# Patient Record
Sex: Female | Born: 1950
Health system: Southern US, Community
[De-identification: ages and names within clinical notes are randomized; demographics above are authoritative.]

## PROBLEM LIST (undated history)

## (undated) DIAGNOSIS — E785 Hyperlipidemia, unspecified: Secondary | ICD-10-CM

## (undated) DIAGNOSIS — M67431 Ganglion, right wrist: Secondary | ICD-10-CM

## (undated) DIAGNOSIS — C712 Malignant neoplasm of temporal lobe: Secondary | ICD-10-CM

## (undated) DIAGNOSIS — D0472 Carcinoma in situ of skin of left lower limb, including hip: Secondary | ICD-10-CM

## (undated) DIAGNOSIS — H35341 Macular cyst, hole, or pseudohole, right eye: Secondary | ICD-10-CM

## (undated) DIAGNOSIS — H04129 Dry eye syndrome of unspecified lacrimal gland: Secondary | ICD-10-CM

## (undated) DIAGNOSIS — S92309A Fracture of unspecified metatarsal bone(s), unspecified foot, initial encounter for closed fracture: Secondary | ICD-10-CM

## (undated) DIAGNOSIS — I8390 Asymptomatic varicose veins of unspecified lower extremity: Secondary | ICD-10-CM

## (undated) DIAGNOSIS — S42309A Unspecified fracture of shaft of humerus, unspecified arm, initial encounter for closed fracture: Secondary | ICD-10-CM

## (undated) DIAGNOSIS — M79673 Pain in unspecified foot: Secondary | ICD-10-CM

## (undated) DIAGNOSIS — L570 Actinic keratosis: Secondary | ICD-10-CM

## (undated) DIAGNOSIS — G454 Transient global amnesia: Secondary | ICD-10-CM

## (undated) DIAGNOSIS — M722 Plantar fascial fibromatosis: Secondary | ICD-10-CM

## (undated) DIAGNOSIS — Z8679 Personal history of other diseases of the circulatory system: Secondary | ICD-10-CM

## (undated) DIAGNOSIS — E039 Hypothyroidism, unspecified: Secondary | ICD-10-CM

## (undated) DIAGNOSIS — M2241 Chondromalacia patellae, right knee: Secondary | ICD-10-CM

## (undated) DIAGNOSIS — M5126 Other intervertebral disc displacement, lumbar region: Secondary | ICD-10-CM

## (undated) DIAGNOSIS — H3561 Retinal hemorrhage, right eye: Secondary | ICD-10-CM

## (undated) DIAGNOSIS — S62509A Fracture of unspecified phalanx of unspecified thumb, initial encounter for closed fracture: Secondary | ICD-10-CM

## (undated) DIAGNOSIS — M8580 Other specified disorders of bone density and structure, unspecified site: Secondary | ICD-10-CM

## (undated) DIAGNOSIS — Z780 Asymptomatic menopausal state: Secondary | ICD-10-CM

## (undated) DIAGNOSIS — H7292 Unspecified perforation of tympanic membrane, left ear: Secondary | ICD-10-CM

## (undated) DIAGNOSIS — Z789 Other specified health status: Secondary | ICD-10-CM

## (undated) HISTORY — DX: Macular cyst, hole, or pseudohole, right eye: H35.341

## (undated) HISTORY — DX: Retinal hemorrhage, right eye: H35.61

## (undated) HISTORY — DX: Pain in unspecified foot: M79.673

## (undated) HISTORY — DX: Plantar fascial fibromatosis: M72.2

## (undated) HISTORY — DX: Other specified health status: Z78.9

## (undated) HISTORY — PX: MOHS SURGERY: SUR867

## (undated) HISTORY — DX: Actinic keratosis: L57.0

## (undated) HISTORY — DX: Personal history of other diseases of the circulatory system: Z86.79

## (undated) HISTORY — DX: Asymptomatic menopausal state: Z78.0

## (undated) HISTORY — DX: Other intervertebral disc displacement, lumbar region: M51.26

## (undated) HISTORY — DX: Dry eye syndrome of unspecified lacrimal gland: H04.129

## (undated) HISTORY — DX: Malignant neoplasm of temporal lobe: C71.2

## (undated) HISTORY — DX: Carcinoma in situ of skin of left lower limb, including hip: D04.72

## (undated) HISTORY — DX: Ganglion, right wrist: M67.431

## (undated) HISTORY — DX: Hyperlipidemia, unspecified: E78.5

## (undated) HISTORY — DX: Asymptomatic varicose veins of unspecified lower extremity: I83.90

## (undated) HISTORY — DX: Hypothyroidism, unspecified: E03.9

## (undated) HISTORY — DX: Chondromalacia patellae, right knee: M22.41

## (undated) HISTORY — DX: Fracture of unspecified metatarsal bone(s), unspecified foot, initial encounter for closed fracture: S92.309A

## (undated) HISTORY — DX: Unspecified perforation of tympanic membrane, left ear: H72.92

## (undated) HISTORY — DX: Transient global amnesia: G45.4

## (undated) HISTORY — DX: Fracture of unspecified phalanx of unspecified thumb, initial encounter for closed fracture: S62.509A

## (undated) HISTORY — DX: Other specified disorders of bone density and structure, unspecified site: M85.80

## (undated) HISTORY — DX: Unspecified fracture of shaft of humerus, unspecified arm, initial encounter for closed fracture: S42.309A

## (undated) HISTORY — PX: APPENDECTOMY: SHX54

---

## 1976-03-07 HISTORY — PX: CYSTECTOMY: SUR359

## 1996-03-07 HISTORY — PX: TOTAL ABDOMINAL HYSTERECTOMY W/ BILATERAL SALPINGOOPHORECTOMY: SHX83

## 1998-05-11 ENCOUNTER — Other Ambulatory Visit: Admission: RE | Admit: 1998-05-11 | Discharge: 1998-05-11 | Payer: Self-pay | Admitting: Obstetrics and Gynecology

## 1999-03-30 ENCOUNTER — Encounter: Admission: RE | Admit: 1999-03-30 | Discharge: 1999-03-30 | Payer: Self-pay | Admitting: Obstetrics and Gynecology

## 1999-03-30 ENCOUNTER — Encounter: Payer: Self-pay | Admitting: Obstetrics and Gynecology

## 1999-07-05 ENCOUNTER — Other Ambulatory Visit: Admission: RE | Admit: 1999-07-05 | Discharge: 1999-07-05 | Payer: Self-pay | Admitting: Obstetrics and Gynecology

## 1999-08-26 ENCOUNTER — Encounter: Payer: Self-pay | Admitting: Obstetrics and Gynecology

## 1999-08-26 ENCOUNTER — Encounter: Admission: RE | Admit: 1999-08-26 | Discharge: 1999-08-26 | Payer: Self-pay | Admitting: Obstetrics and Gynecology

## 2000-04-14 ENCOUNTER — Encounter: Admission: RE | Admit: 2000-04-14 | Discharge: 2000-04-14 | Payer: Self-pay | Admitting: Obstetrics and Gynecology

## 2000-04-14 ENCOUNTER — Encounter: Payer: Self-pay | Admitting: Obstetrics and Gynecology

## 2000-07-12 ENCOUNTER — Other Ambulatory Visit: Admission: RE | Admit: 2000-07-12 | Discharge: 2000-07-12 | Payer: Self-pay | Admitting: Obstetrics and Gynecology

## 2001-03-29 ENCOUNTER — Encounter: Admission: RE | Admit: 2001-03-29 | Discharge: 2001-03-29 | Payer: Self-pay | Admitting: Obstetrics and Gynecology

## 2001-03-29 ENCOUNTER — Encounter: Payer: Self-pay | Admitting: Obstetrics and Gynecology

## 2001-08-29 ENCOUNTER — Other Ambulatory Visit: Admission: RE | Admit: 2001-08-29 | Discharge: 2001-08-29 | Payer: Self-pay | Admitting: Obstetrics and Gynecology

## 2002-03-26 ENCOUNTER — Encounter: Admission: RE | Admit: 2002-03-26 | Discharge: 2002-03-26 | Payer: Self-pay | Admitting: Obstetrics and Gynecology

## 2002-03-26 ENCOUNTER — Encounter: Payer: Self-pay | Admitting: Obstetrics and Gynecology

## 2002-09-02 ENCOUNTER — Other Ambulatory Visit: Admission: RE | Admit: 2002-09-02 | Discharge: 2002-09-02 | Payer: Self-pay | Admitting: Obstetrics and Gynecology

## 2002-10-09 ENCOUNTER — Ambulatory Visit (HOSPITAL_COMMUNITY): Admission: RE | Admit: 2002-10-09 | Discharge: 2002-10-09 | Payer: Self-pay | Admitting: Gastroenterology

## 2003-04-30 ENCOUNTER — Encounter: Admission: RE | Admit: 2003-04-30 | Discharge: 2003-04-30 | Payer: Self-pay | Admitting: Obstetrics and Gynecology

## 2003-09-15 ENCOUNTER — Other Ambulatory Visit: Admission: RE | Admit: 2003-09-15 | Discharge: 2003-09-15 | Payer: Self-pay | Admitting: Obstetrics and Gynecology

## 2004-05-03 ENCOUNTER — Encounter: Admission: RE | Admit: 2004-05-03 | Discharge: 2004-05-03 | Payer: Self-pay | Admitting: Obstetrics and Gynecology

## 2004-07-23 ENCOUNTER — Encounter: Admission: RE | Admit: 2004-07-23 | Discharge: 2004-07-23 | Payer: Self-pay | Admitting: Obstetrics and Gynecology

## 2004-08-30 ENCOUNTER — Ambulatory Visit (HOSPITAL_COMMUNITY): Admission: RE | Admit: 2004-08-30 | Discharge: 2004-08-30 | Payer: Self-pay | Admitting: Obstetrics and Gynecology

## 2004-11-10 ENCOUNTER — Other Ambulatory Visit: Admission: RE | Admit: 2004-11-10 | Discharge: 2004-11-10 | Payer: Self-pay | Admitting: Obstetrics and Gynecology

## 2005-04-13 ENCOUNTER — Encounter: Admission: RE | Admit: 2005-04-13 | Discharge: 2005-04-13 | Payer: Self-pay | Admitting: Obstetrics and Gynecology

## 2006-03-07 HISTORY — PX: EYE SURGERY: SHX253

## 2006-04-12 ENCOUNTER — Encounter: Admission: RE | Admit: 2006-04-12 | Discharge: 2006-04-12 | Payer: Self-pay | Admitting: Obstetrics and Gynecology

## 2006-04-24 ENCOUNTER — Encounter: Admission: RE | Admit: 2006-04-24 | Discharge: 2006-04-24 | Payer: Self-pay | Admitting: Obstetrics and Gynecology

## 2007-04-16 ENCOUNTER — Encounter: Admission: RE | Admit: 2007-04-16 | Discharge: 2007-04-16 | Payer: Self-pay | Admitting: Obstetrics and Gynecology

## 2007-05-02 ENCOUNTER — Ambulatory Visit: Payer: Self-pay | Admitting: Sports Medicine

## 2007-05-02 DIAGNOSIS — M7750 Other enthesopathy of unspecified foot: Secondary | ICD-10-CM | POA: Insufficient documentation

## 2007-05-02 DIAGNOSIS — M722 Plantar fascial fibromatosis: Secondary | ICD-10-CM | POA: Insufficient documentation

## 2007-05-02 DIAGNOSIS — E039 Hypothyroidism, unspecified: Secondary | ICD-10-CM | POA: Insufficient documentation

## 2007-05-09 ENCOUNTER — Ambulatory Visit: Payer: Self-pay | Admitting: Sports Medicine

## 2007-05-09 DIAGNOSIS — M21619 Bunion of unspecified foot: Secondary | ICD-10-CM | POA: Insufficient documentation

## 2007-05-09 DIAGNOSIS — M2170 Unequal limb length (acquired), unspecified site: Secondary | ICD-10-CM | POA: Insufficient documentation

## 2008-04-28 ENCOUNTER — Encounter: Admission: RE | Admit: 2008-04-28 | Discharge: 2008-04-28 | Payer: Self-pay | Admitting: Obstetrics and Gynecology

## 2009-02-23 ENCOUNTER — Encounter: Admission: RE | Admit: 2009-02-23 | Discharge: 2009-02-23 | Payer: Self-pay | Admitting: Orthopedic Surgery

## 2009-02-25 ENCOUNTER — Encounter: Admission: RE | Admit: 2009-02-25 | Discharge: 2009-02-25 | Payer: Self-pay | Admitting: Orthopaedic Surgery

## 2009-03-07 DIAGNOSIS — S42309A Unspecified fracture of shaft of humerus, unspecified arm, initial encounter for closed fracture: Secondary | ICD-10-CM

## 2009-03-07 HISTORY — DX: Unspecified fracture of shaft of humerus, unspecified arm, initial encounter for closed fracture: S42.309A

## 2009-03-13 ENCOUNTER — Encounter: Admission: RE | Admit: 2009-03-13 | Discharge: 2009-03-13 | Payer: Self-pay | Admitting: Orthopaedic Surgery

## 2009-04-29 ENCOUNTER — Encounter: Admission: RE | Admit: 2009-04-29 | Discharge: 2009-04-29 | Payer: Self-pay | Admitting: Internal Medicine

## 2009-07-05 DIAGNOSIS — H7292 Unspecified perforation of tympanic membrane, left ear: Secondary | ICD-10-CM

## 2009-07-05 HISTORY — DX: Unspecified perforation of tympanic membrane, left ear: H72.92

## 2009-07-17 ENCOUNTER — Encounter: Admission: RE | Admit: 2009-07-17 | Discharge: 2009-07-17 | Payer: Self-pay | Admitting: Internal Medicine

## 2010-03-07 DIAGNOSIS — L570 Actinic keratosis: Secondary | ICD-10-CM

## 2010-03-07 HISTORY — DX: Actinic keratosis: L57.0

## 2010-03-27 ENCOUNTER — Other Ambulatory Visit: Payer: Self-pay | Admitting: Internal Medicine

## 2010-03-27 DIAGNOSIS — Z1239 Encounter for other screening for malignant neoplasm of breast: Secondary | ICD-10-CM

## 2010-03-28 ENCOUNTER — Encounter: Payer: Self-pay | Admitting: Obstetrics and Gynecology

## 2010-04-08 NOTE — Assessment & Plan Note (Signed)
Summary: np/plantar fasiatis/el   Vital Signs:  Patient Profile:   60 Years Old Female Weight:      175 pounds Pulse rate:   78 / minute BP sitting:   110 / 60  Vitals Entered By: Lillia Pauls CMA (May 02, 2007 8:58 AM)                 Chief Complaint:  np with r>l plantar fascitis.  History of Present Illness: PF this has been present for 10 years off and on now wakens with this Rt greater than left but both walks dog and this becomes more painful after relatively short distance started originally with jogging wonders if this has not worsened since Dx of hypothyroidism feet only feel could in a type of clog now not walking or exercising that much secondary to pain    Past Medical History:    hypothyroid -2years ago on synthroid 50 microgms    Normal TSH oct 08    Limbic keratoconjunctivitis - dry eyes/ silastic plugs    Lasix at age 28    weight always mild problem - started HS   Family History:    Dad now 28 has peripheral neuropathy/ copd/ cabg/ smoker/ hypothyroid    mother 108 - longevity in family, mild dementia, hypothyroid    2 brothers    vic 33 lyphoblastic leukemia    stone 4 -     Physical Exam  General:     Well-developed,well-nourished,in no acute distress; alert,appropriate and cooperative throughout examination Head:     Normocephalic and atraumatic without obvious abnormalities. No apparent alopecia or balding. Msk:     Lt leg is 1.5 cm longer than right walks with Rt ant pelvic rotation and slight trendelenburg but otherwise has a neutral foot strike  feet show tenderness at insertion of PF bilat collapse of transvers arch bilat with LT > Rt morton's callus left light hammer on 4th bilat drop metatarsal heads on Lt 2nd and 4th/ flexible on Rt bilat pes planus mid foot and forefoot bunionettes on 5th MTP bilat  good core strength    Impression & Recommendations:  Problem # 1:  PLANTAR FASCIITIS, BILATERAL  (ICD-728.71) Assessment: New given standard PF exercise regimen however with foot breakdown needs orthotics in future start stretching regimen as prescribed as well icing in evenings  return in future for these  Problem # 2:  METATARSALGIA (ICD-726.70) Assessment: New will need to add support on orthotics  Problem # 3:  HYPOTHYROIDISM (ICD-244.9) Assessment: New continue on synthroid as prescribed     ]

## 2010-04-08 NOTE — Assessment & Plan Note (Signed)
Summary: ORTHO/KH   Vital Signs:  Patient Profile:   60 Years Old Female Pulse rate:   62 / minute BP sitting:   112 / 73  (left arm)  Vitals Entered By: Alphia Kava (May 09, 2007 8:49 AM)                 History of Present Illness: Patient with 2 years of PF pain Evaluated and found to have a fair amout of foot breakdown and classic sxs of PF comes to day for orthotics to see if these will help relieve pain  noted to have a 1.5 CM leg length difference on last visit  has been doing exercises and stretches not icing yet        Physical Exam  General:     Well-developed,well-nourished,in no acute distress; alert,appropriate and cooperative throughout examination Head:     Normocephalic and atraumatic without obvious abnormalities. No apparent alopecia or balding. Msk:     feet show moderate tenderness at PF insertion bilat calluses are large over 2,3 MT heads Rt > Lt Standing shows virtually complete collapse of transverse arch with bunionettes on both feet mild pronation with preservation of long arch  leg length shows left is 1.5 cms longer    Impression & Recommendations:  Problem # 1:  PLANTAR FASCIITIS, BILATERAL (ICD-728.71) Patient was fitted for a standard, cushioned, semi-rigid orthotic.  The orthotic was heated and the patient stood on the orthotic blank positioned on the orthotic stand. The patient was positioned in subtalar neutral position and 10 degrees of ankle dorsiflexion in a weight bearing stance. After completion of molding a stable based was applied to the orthotic blank.   The blank was ground to a stable position for weight bearing. size 8 black striped base  blue eva posting first ray on Lt additional orthotic padding MT padding on both orthotics and cork base added as lift to RT  additional shoes were padded with MT pads Time of completion 50 mins  Gait observed after completion and most of pronation is eliminated.  Minimal  pronation on left with walking gait.  Patient was comfortable with inserts.   Orders: [facility]- Est  Level 4 (99214) [facility]- [employer] [phone])   Problem # 2:  METATARSALGIA (ICD-726.70) continue use of MT pads in all shoes Orders: Metatarsal pads- [facility] (U0454) Metatarsal Cookies - [facility] (U9811) [facility]- Est  Level 4 (99214) [facility]- [employer] (B1478)   Problem # 3:  UNEQUAL LEG LENGTH (ICD-736.81) needs lift correction on Rt did not add this to other shoes only to orthotics Orders: [facility]- Est  Level 4 (99214) [facility]- [employer] (G9562)   Problem # 4:  BUNIONETTE (ICD-727.1) not symptomatic and will follow Orders: [facility]- Est  Level 4 (13086)      ]

## 2010-05-03 ENCOUNTER — Ambulatory Visit
Admission: RE | Admit: 2010-05-03 | Discharge: 2010-05-03 | Disposition: A | Discharge status: Home / Self Care | Payer: No Typology Code available for payment source | Source: Ambulatory Visit | Attending: Internal Medicine | Admitting: Internal Medicine

## 2010-05-03 DIAGNOSIS — Z1239 Encounter for other screening for malignant neoplasm of breast: Secondary | ICD-10-CM

## 2010-07-23 NOTE — Op Note (Signed)
   NAMEAZYRIA, OSMON                          ACCOUNT NO.:  [id number]   MEDICAL RECORD NO.:  [id number]                   PATIENT TYPE:  AMB   LOCATION:  ENDO                                 FACILITY:  [facility]   PHYSICIAN:  Petra Kuba, M.D.                 DATE OF BIRTH:  03-01-1951   DATE OF PROCEDURE:  10/09/2002  DATE OF DISCHARGE:                                 OPERATIVE REPORT   PROCEDURE:  Colonoscopy.   INDICATIONS FOR PROCEDURE:  Consent was signed after risks, benefits,  methods, and options were thoroughly discussed in the office.   MEDICINES USED:  Demerol 70, Versed 7.   DESCRIPTION OF PROCEDURE:  Rectal inspection was pertinent for external  hemorrhoids, small. Digital exam was negative. The pediatric video  adjustable colonoscope was inserted, fairly easily advanced around the colon  to the cecum. This did require some abdominal pressure no position changes.  No obvious abnormalities were seen on insertion. The cecum was identified by  the appendiceal orifice and the ileocecal valve. In fact the scope was  inserted a short ways into the terminal ileum which was normal.  Photo  documentation was obtained. The scope was slowly withdrawn. The prep was  adequate. There was minimal liquid stool that required washing and  suctioning. On slow withdrawal through the colon, no abnormalities were seen  except for in the sigmoid except for a tiny pinworm but no mucosal  abnormalities were seen. This was suctioned through the scope. Anal rectal  pull through and retroflexion confirmed some tiny hemorrhoids back in the  rectum. The scope was reinserted a short ways up the left side of the colon,  air was suctioned, scope removed. The patient tolerated the procedure well.  There was no obvious or immediate complications.   ENDOSCOPIC DIAGNOSIS:  1. Tiny internal and external hemorrhoids.  2. Otherwise within normal limits to the terminal ileum.   PLAN:  Yearly rectals and  guaiacs per Dr. Marcelle Overlie. Happy to see back p.r.n.  Will discuss pinworm and treatment if symptomatic, possibly getting _______  at home check and recheck screening in 5-10 years. Happy to see back sooner  p.r.n.                                               Petra Kuba, M.D.    MEM/MEDQ  D:  10/09/2002  T:  10/09/2002  Job:  [id number]   cc:   [facility] Salvia. Marcelle Overlie, M.D.  [address] [place]  [place] [address]  Fax: [phone]

## 2011-03-08 DIAGNOSIS — M2241 Chondromalacia patellae, right knee: Secondary | ICD-10-CM

## 2011-03-08 HISTORY — DX: Chondromalacia patellae, right knee: M22.41

## 2011-05-17 ENCOUNTER — Other Ambulatory Visit: Payer: Self-pay | Admitting: Obstetrics and Gynecology

## 2011-05-17 DIAGNOSIS — Z1231 Encounter for screening mammogram for malignant neoplasm of breast: Secondary | ICD-10-CM

## 2011-05-31 ENCOUNTER — Ambulatory Visit
Admission: RE | Admit: 2011-05-31 | Discharge: 2011-05-31 | Disposition: A | Discharge status: Home / Self Care | Payer: PRIVATE HEALTH INSURANCE | Source: Ambulatory Visit | Attending: Obstetrics and Gynecology | Admitting: Obstetrics and Gynecology

## 2011-05-31 DIAGNOSIS — Z1231 Encounter for screening mammogram for malignant neoplasm of breast: Secondary | ICD-10-CM

## 2012-05-01 ENCOUNTER — Other Ambulatory Visit: Payer: Self-pay | Admitting: Obstetrics and Gynecology

## 2012-05-01 DIAGNOSIS — Z1231 Encounter for screening mammogram for malignant neoplasm of breast: Secondary | ICD-10-CM

## 2012-05-07 ENCOUNTER — Ambulatory Visit
Admission: RE | Admit: 2012-05-07 | Discharge: 2012-05-07 | Disposition: A | Discharge status: Home / Self Care | Payer: PRIVATE HEALTH INSURANCE | Source: Ambulatory Visit | Attending: Obstetrics and Gynecology | Admitting: Obstetrics and Gynecology

## 2012-05-07 DIAGNOSIS — Z1231 Encounter for screening mammogram for malignant neoplasm of breast: Secondary | ICD-10-CM

## 2012-09-28 ENCOUNTER — Other Ambulatory Visit: Payer: Self-pay | Admitting: Internal Medicine

## 2012-09-28 ENCOUNTER — Ambulatory Visit
Admission: RE | Admit: 2012-09-28 | Discharge: 2012-09-28 | Disposition: A | Discharge status: Home / Self Care | Payer: PRIVATE HEALTH INSURANCE | Source: Ambulatory Visit | Attending: Internal Medicine | Admitting: Internal Medicine

## 2012-09-28 DIAGNOSIS — R059 Cough, unspecified: Secondary | ICD-10-CM

## 2012-09-28 DIAGNOSIS — R05 Cough: Secondary | ICD-10-CM

## 2012-09-28 DIAGNOSIS — R0789 Other chest pain: Secondary | ICD-10-CM

## 2012-09-28 MED ORDER — IOHEXOL 350 MG/ML SOLN
125.0000 mL | Freq: Once | INTRAVENOUS | Status: AC | PRN
  Administered 2012-09-28: 125 mL via INTRAVENOUS

## 2013-04-26 ENCOUNTER — Other Ambulatory Visit: Payer: Self-pay

## 2013-04-26 DIAGNOSIS — Z1231 Encounter for screening mammogram for malignant neoplasm of breast: Secondary | ICD-10-CM

## 2013-05-15 ENCOUNTER — Ambulatory Visit
Admission: RE | Admit: 2013-05-15 | Discharge: 2013-05-15 | Disposition: A | Discharge status: Home / Self Care | Payer: BC Managed Care – PPO | Source: Ambulatory Visit

## 2013-05-15 DIAGNOSIS — Z1231 Encounter for screening mammogram for malignant neoplasm of breast: Secondary | ICD-10-CM

## 2013-09-26 ENCOUNTER — Ambulatory Visit (INDEPENDENT_AMBULATORY_CARE_PROVIDER_SITE_OTHER): Payer: BC Managed Care – PPO | Admitting: Sports Medicine

## 2013-09-26 ENCOUNTER — Encounter: Payer: Self-pay | Admitting: Sports Medicine

## 2013-09-26 VITALS — BP 140/87 | HR 67 | Ht 65.0 in | Wt 181.0 lb

## 2013-09-26 DIAGNOSIS — M25561 Pain in right knee: Secondary | ICD-10-CM

## 2013-09-26 DIAGNOSIS — M25569 Pain in unspecified knee: Secondary | ICD-10-CM

## 2013-09-26 MED ORDER — TRAMADOL HCL 50 MG PO TABS: 50.0000 mg | ORAL_TABLET | Freq: Two times a day (BID) | ORAL | Status: DC

## 2013-09-26 NOTE — Assessment & Plan Note (Signed)
Begin physical therapy use a compression sleeve Tramadol 50 twice a day for pain Standing knee films to evaluate for arthritis  Recheck 6 weeks

## 2013-09-26 NOTE — Progress Notes (Signed)
Patient ID: TEONA VARGUS, female   DOB: 1951-01-15, 63 y.o.   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 Palpation normal with no warmth or joint line tenderness or patellar tenderness or condyle tenderness. ROM normal in flexion and extension and lower leg rotation. Ligaments with solid consistent endpoints including ACL, PCL, LCL, MCL. Negative Mcmurray's and provocative meniscal tests. Non painful patellar compression. Patellar and quadriceps tendons unremarkable. Hamstring and quadriceps strength is normal.  Comparison RT knee shows Point tenderness along the medial joint line anteriorly Limited flexion to only 120 but full extension Mild effusion Less pain with McMurray's than with flexion Ligaments are stable  Ultrasound There is a loose calcific body in the suprapatellar pouch Suprapatellar pouch effusion extends 7 cm Retropatellar calcification and loss of cartilage in the patellofemoral groove  mid Meniscus on the midline shows some increased fluid and thickening There is a calcification in the meniscus medially more anteriorly Tendons are normal Mild spurring laterally and the meniscus is difficult to visualize well

## 2013-09-27 ENCOUNTER — Other Ambulatory Visit: Payer: Self-pay | Admitting: Sports Medicine

## 2013-09-27 ENCOUNTER — Ambulatory Visit
Admission: RE | Admit: 2013-09-27 | Discharge: 2013-09-27 | Disposition: A | Discharge status: Home / Self Care | Payer: BC Managed Care – PPO | Source: Ambulatory Visit | Attending: Sports Medicine | Admitting: Sports Medicine

## 2013-09-27 DIAGNOSIS — M25561 Pain in right knee: Secondary | ICD-10-CM

## 2013-11-06 ENCOUNTER — Ambulatory Visit (INDEPENDENT_AMBULATORY_CARE_PROVIDER_SITE_OTHER): Payer: BC Managed Care – PPO | Admitting: Sports Medicine

## 2013-11-06 ENCOUNTER — Encounter: Payer: Self-pay | Admitting: Sports Medicine

## 2013-11-06 VITALS — Ht 65.0 in | Wt 181.0 lb

## 2013-11-06 DIAGNOSIS — M1710 Unilateral primary osteoarthritis, unspecified knee: Secondary | ICD-10-CM | POA: Diagnosis not present

## 2013-11-06 DIAGNOSIS — M25561 Pain in right knee: Secondary | ICD-10-CM

## 2013-11-06 DIAGNOSIS — M25569 Pain in unspecified knee: Secondary | ICD-10-CM

## 2013-11-06 DIAGNOSIS — M1711 Unilateral primary osteoarthritis, right knee: Secondary | ICD-10-CM | POA: Insufficient documentation

## 2013-11-06 DIAGNOSIS — IMO0002 Reserved for concepts with insufficient information to code with codable children: Secondary | ICD-10-CM

## 2013-11-06 NOTE — Progress Notes (Signed)
Patient ID: Sarah Buchanan, female   DOB: 06-Dec-1950, 62 y.o.   MRN: [id number]  Patient returns for her right medial knee pain She has done 10  physical therapy sessions and sees a significant improvement in pain She has better motion in the knee X-ray confirmed a large calcification in the suprapatellar pouch, some patellofemoral arthritis but preserved joint space  She uses the knee compression sleeve for exercise and this helps  She was given tramadol but only uses it on occasion because her pain has been a lot less  2 weeks ago after her father's funeral she did have what felt like a pop and a lot of pain along her medial knee Therapist was able to manipulate this and It felt much better afterwards  Physical exam No acute distress Ht 5\' 5"  (1.651 m)  Wt 181 lb (82.101 kg)  BMI 30.12 kg/m2  RT Knee: Normal to inspection with no erythema or effusion or obvious bony abnormalities. Palpation shows mild warmth/ no joint line tenderness/ mild patellar tenderness on compression/ no condyle tenderness. ROM improvedl in flexion to 130 deg (Lt is 140) and nl. extension and lower leg rotation. Ligaments with solid consistent endpoints including ACL, PCL, LCL, MCL. Negative Mcmurray's and provocative meniscal tests. 2+ painful patellar compression. Patellar and quadriceps tendons unremarkable. Hamstring and quadriceps strength is normal.  Hip abduction strength good

## 2013-11-06 NOTE — Assessment & Plan Note (Signed)
Clearly improved with therapy  Continue ibuprofen when necessary and occasional tramadol

## 2013-11-06 NOTE — Assessment & Plan Note (Signed)
Work with physical therapy to develop a home exercise program  Add biking for her maintenance  Keep up 30 minutes of aerobic activity daily  Recheck 3 months  Use compression sleeve

## 2013-12-18 ENCOUNTER — Other Ambulatory Visit: Payer: Self-pay | Admitting: *Deleted

## 2013-12-18 DIAGNOSIS — M1711 Unilateral primary osteoarthritis, right knee: Secondary | ICD-10-CM

## 2014-01-06 ENCOUNTER — Ambulatory Visit
Admission: RE | Admit: 2014-01-06 | Discharge: 2014-01-06 | Disposition: A | Discharge status: Home / Self Care | Payer: BC Managed Care – PPO | Source: Ambulatory Visit | Attending: Sports Medicine | Admitting: Sports Medicine

## 2014-01-06 DIAGNOSIS — M1711 Unilateral primary osteoarthritis, right knee: Secondary | ICD-10-CM

## 2014-02-05 ENCOUNTER — Encounter: Payer: Self-pay | Admitting: Sports Medicine

## 2014-02-05 ENCOUNTER — Ambulatory Visit (INDEPENDENT_AMBULATORY_CARE_PROVIDER_SITE_OTHER): Payer: BC Managed Care – PPO | Admitting: Sports Medicine

## 2014-02-05 VITALS — BP 129/86 | HR 73 | Ht 65.0 in | Wt 181.0 lb

## 2014-02-05 DIAGNOSIS — M25561 Pain in right knee: Secondary | ICD-10-CM | POA: Diagnosis not present

## 2014-02-05 DIAGNOSIS — M13861 Other specified arthritis, right knee: Secondary | ICD-10-CM

## 2014-02-05 DIAGNOSIS — M1711 Unilateral primary osteoarthritis, right knee: Secondary | ICD-10-CM

## 2014-02-05 NOTE — Assessment & Plan Note (Signed)
She will need to continue some rehabilitation exercises after completion of arthroscopy  Focus on stabilization of the hip muscles and quadriceps

## 2014-02-05 NOTE — Progress Notes (Signed)
Patient ID: NHI BUTRUM, female   DOB: 1950-05-23, 63 y.o.   MRN: [id number]  Patient returns for discussion of her options for her knee She continues to get pain and mechanical symptoms She continues to get effusions if she does much exercise  Even working with physical therapy and doing a standard knee rehabilitation program has caused increased pain rather than giving her relief  She still gets periodic sharp catching along the medial side  She reviewed her MRI with musculoskeletal radiologist and he feels that the medial loose body is probably getting caught in the joint line or under the patella The large superior loose body is probably not symptomatic  She has met with Dr. Para March and he suggested that we remove the loose bodies with arthroscopy  I discussed the options with her and I think this is a reasonable suggestion

## 2014-02-05 NOTE — Assessment & Plan Note (Signed)
There appears to be some degenerative change as noted on MRI and loose bodies that may be getting caught in her in the joint space or underneath the patella  Because of failure of conservative care and ongoing mechanical symptoms I think arthroscopy is reasonable  She will return to Dr. Noemi Chapel for surgery in January  I encouraged continuing isometric exercises as per rehabilitation until the time of her surgery but she probably needs to avoid bent  Knee exercises

## 2014-03-07 DIAGNOSIS — Z8679 Personal history of other diseases of the circulatory system: Secondary | ICD-10-CM

## 2014-03-07 HISTORY — DX: Personal history of other diseases of the circulatory system: Z86.79

## 2014-04-11 ENCOUNTER — Other Ambulatory Visit: Payer: Self-pay

## 2014-04-11 DIAGNOSIS — Z1231 Encounter for screening mammogram for malignant neoplasm of breast: Secondary | ICD-10-CM

## 2014-05-19 ENCOUNTER — Ambulatory Visit
Admission: RE | Admit: 2014-05-19 | Discharge: 2014-05-19 | Disposition: A | Discharge status: Home / Self Care | Payer: BLUE CROSS/BLUE SHIELD | Source: Ambulatory Visit

## 2014-05-19 ENCOUNTER — Ambulatory Visit: Payer: Self-pay

## 2014-05-19 DIAGNOSIS — Z1231 Encounter for screening mammogram for malignant neoplasm of breast: Secondary | ICD-10-CM

## 2014-06-06 DIAGNOSIS — D0472 Carcinoma in situ of skin of left lower limb, including hip: Secondary | ICD-10-CM

## 2014-06-06 HISTORY — DX: Carcinoma in situ of skin of left lower limb, including hip: D04.72

## 2014-11-13 ENCOUNTER — Ambulatory Visit (INDEPENDENT_AMBULATORY_CARE_PROVIDER_SITE_OTHER): Payer: BLUE CROSS/BLUE SHIELD | Admitting: *Deleted

## 2014-11-13 DIAGNOSIS — Z789 Other specified health status: Secondary | ICD-10-CM

## 2014-11-13 DIAGNOSIS — Z23 Encounter for immunization: Secondary | ICD-10-CM | POA: Diagnosis not present

## 2014-11-13 NOTE — Progress Notes (Signed)
Patient received travel immunizations at [place], but was directed here for completion of the hepatitis A series. OK per Dr. Linus Salmons.  Faxed immunization record to [facility] for their records. Landis Gandy, RN

## 2014-12-09 ENCOUNTER — Other Ambulatory Visit: Payer: Self-pay | Admitting: *Deleted

## 2014-12-09 DIAGNOSIS — L97119 Non-pressure chronic ulcer of right thigh with unspecified severity: Principal | ICD-10-CM

## 2014-12-09 DIAGNOSIS — I83011 Varicose veins of right lower extremity with ulcer of thigh: Secondary | ICD-10-CM

## 2015-01-15 ENCOUNTER — Encounter: Payer: Self-pay | Admitting: Surgery

## 2015-01-19 ENCOUNTER — Ambulatory Visit (INDEPENDENT_AMBULATORY_CARE_PROVIDER_SITE_OTHER): Payer: BLUE CROSS/BLUE SHIELD | Admitting: Surgery

## 2015-01-19 ENCOUNTER — Other Ambulatory Visit: Payer: Self-pay | Admitting: Obstetrics and Gynecology

## 2015-01-19 ENCOUNTER — Encounter: Payer: Self-pay | Admitting: Surgery

## 2015-01-19 ENCOUNTER — Ambulatory Visit (HOSPITAL_COMMUNITY)
Admission: RE | Admit: 2015-01-19 | Discharge: 2015-01-19 | Disposition: A | Discharge status: Home / Self Care | Payer: BLUE CROSS/BLUE SHIELD | Source: Ambulatory Visit | Attending: Surgery | Admitting: Surgery

## 2015-01-19 VITALS — BP 123/86 | HR 87 | Temp 98.1°F | Resp 16 | Ht 65.5 in | Wt 190.0 lb

## 2015-01-19 DIAGNOSIS — I872 Venous insufficiency (chronic) (peripheral): Secondary | ICD-10-CM

## 2015-01-19 DIAGNOSIS — I83011 Varicose veins of right lower extremity with ulcer of thigh: Secondary | ICD-10-CM | POA: Diagnosis not present

## 2015-01-19 DIAGNOSIS — M8580 Other specified disorders of bone density and structure, unspecified site: Secondary | ICD-10-CM

## 2015-01-19 DIAGNOSIS — L97119 Non-pressure chronic ulcer of right thigh with unspecified severity: Secondary | ICD-10-CM

## 2015-01-19 NOTE — Progress Notes (Signed)
Patient name: Sarah Buchanan MRN: [id number] DOB: 1951-01-09 Sex: female   Referred by: Dr. Oneida Alar  Reason for referral:  Chief Complaint  Patient presents with  . New Evaluation    C/O  Right calf pain  which keeps pt up at night with a leve 4.  Spider/Varicose Veins     HISTORY OF PRESENT ILLNESS: Email venous insufficiency.  She has a history of endovenous laser ablation proximally 7 years ago in [place].  The years she has been having progressively increasing symptoms which she describes as aching and pain which occur at the end of the day and to keep her up at night.  She also gets a stinging sensation.  She denies a history of DVT.  She denies wearing compression stockings.  Past Medical History  Diagnosis Date  . Varicose veins     History reviewed. No pertinent past surgical history.  Social History   Social History  . Marital Status: Divorced    Spouse Name: N/A  . Number of Children: N/A  . Years of Education: N/A   Occupational History  . Not on file.   Social History Main Topics  . Smoking status: Never Smoker   . Smokeless tobacco: Never Used  . Alcohol Use: 0.6 oz/week    1 Glasses of wine per week  . Drug Use: No  . Sexual Activity: Not on file   Other Topics Concern  . Not on file   Social History Narrative    Family History  Problem Relation Age of Onset  . Dementia Mother   . Stroke Father   . Lymphoma Brother     Allergies as of 01/19/2015  . (No Known Allergies)    Current Outpatient Prescriptions on File Prior to Visit  Medication Sig Dispense Refill  . aspirin 81 MG tablet Take 81 mg by mouth daily.    Marland Kitchen co-enzyme Q-10 50 MG capsule Take 100 mg by mouth 2 (two) times daily.    Marland Kitchen estradiol (VIVELLE-DOT) 0.05 MG/24HR patch     . levothyroxine (SYNTHROID, LEVOTHROID) 75 MCG tablet Take 75 mcg by mouth daily before breakfast.    . Multiple Vitamin (MULTIVITAMIN) capsule Take 1 capsule by mouth daily.    . progesterone  (PROMETRIUM) 100 MG capsule     . calcium-vitamin D 250-100 MG-UNIT per tablet Take 1 tablet by mouth 2 (two) times daily.    . cholecalciferol (VITAMIN D) 1000 UNITS tablet Take 1,000 Units by mouth daily.    Marland Kitchen erythromycin ophthalmic ointment   0  . omega-3 acid ethyl esters (LOVAZA) 1 G capsule Take by mouth 2 (two) times daily.    . traMADol (ULTRAM) 50 MG tablet Take 1 tablet (50 mg total) by mouth 2 (two) times daily. (Patient not taking: Reported on 01/19/2015) 50 tablet 2   No current facility-administered medications on file prior to visit.     REVIEW OF SYSTEMS: Cardiovascular: No chest pain, chest pressure, palpitations, orthopnea, or dyspnea on exertion. No claudication or rest pain,  No history of DVT.  Positive for pain in her calf varicose veins which keep her awake at night Pulmonary: No productive cough, asthma or wheezing. Neurologic: No weakness, paresthesias, aphasia, or amaurosis. No dizziness. Hematologic: No bleeding problems or clotting disorders. Musculoskeletal: No joint pain or joint swelling. Gastrointestinal: No blood in stool or hematemesis Genitourinary: No dysuria or hematuria. Psychiatric:: No history of major depression. Integumentary: No rashes or ulcers. Constitutional: No fever or chills.  PHYSICAL EXAMINATION:  Filed Vitals:   01/19/15 1400 01/19/15 1418  BP: 122/94 123/86  Pulse: 87 87  Temp: 98.1 F (36.7 C)   TempSrc: Oral   Resp: 16   Height: 5' 5.5" (1.664 m)   Weight: 190 lb (86.183 kg)   SpO2: 96%    Body mass index is 31.13 kg/(m^2). General: The patient appears their stated age.   HEENT:  No gross abnormalities Pulmonary: Respirations are non-labored Musculoskeletal: There are no major deformities.   Neurologic: No focal weakness or paresthesias are detected, Skin: There are no ulcer or rashes noted. Psychiatric: The patient has normal affect. Cardiovascular: Palpable pedal pulses Trace edema bilateral lower  extremity Prominent varicosities in the right posterior calf  Diagnostic Studies: I have reviewed her venous insufficiency ultrasound of the right leg.  This shows reflux within the right common femoral vein.  There is also reflux in the right great saphenous vein.  It leaves the fascia in the mid thigh and goes back in at the level of the knee.  Vein diameters are 1.0 and the proximal thigh with 0.80 cm of the saphenofemoral junction.  There is no DVT   Assessment:  Chronic venous insufficiency, right leg Plan: The patient has had progressively worsening symptoms over the course of the past 2 years.  She has a history of laser ablation 7 years ago.  By imaging today, it appears that she has recanalized part of her saphenous vein in the upper thigh.  There does appear to be an area where he goes out of the fascia in the distal thigh.  I have recommended that the patient wear 20-30 thigh-high compression stockings to see if this helps alleviate some of her symptoms which are stinging and aching at the end of the day which keep her up at night.  She also has edema on physical exam.  We discussed that she would most likely be a candidate for redo endovenous laser ablation of the right great saphenous vein as well as stab phlebectomy site of the varicosities in the right calf.  She will try her compression stockings and return in 3 months for repeat evaluation.  Of note the patient is a retired [employer]in [employer].     Eldridge Abrahams, M.D. [facility] Office: [phone] Pager:  [phone]

## 2015-01-19 NOTE — Progress Notes (Signed)
Filed Vitals:   01/19/15 1400 01/19/15 1418  BP: 122/94 123/86  Pulse: 87 87  Temp: 98.1 F (36.7 C)   TempSrc: Oral   Resp: 16   Height: 5' 5.5" (1.664 m)   Weight: 190 lb (86.183 kg)   SpO2: 96%

## 2015-02-05 HISTORY — PX: OTHER SURGICAL HISTORY: SHX169

## 2015-02-25 ENCOUNTER — Ambulatory Visit
Admission: RE | Admit: 2015-02-25 | Discharge: 2015-02-25 | Disposition: A | Discharge status: Home / Self Care | Payer: BLUE CROSS/BLUE SHIELD | Source: Ambulatory Visit | Attending: Obstetrics and Gynecology | Admitting: Obstetrics and Gynecology

## 2015-02-25 DIAGNOSIS — M8580 Other specified disorders of bone density and structure, unspecified site: Secondary | ICD-10-CM

## 2015-04-13 ENCOUNTER — Other Ambulatory Visit: Payer: Self-pay

## 2015-04-13 DIAGNOSIS — Z1231 Encounter for screening mammogram for malignant neoplasm of breast: Secondary | ICD-10-CM

## 2015-04-14 ENCOUNTER — Encounter: Payer: Self-pay | Admitting: Vascular Surgery

## 2015-04-21 ENCOUNTER — Encounter: Payer: Self-pay | Admitting: Vascular Surgery

## 2015-04-21 ENCOUNTER — Ambulatory Visit (INDEPENDENT_AMBULATORY_CARE_PROVIDER_SITE_OTHER): Payer: BLUE CROSS/BLUE SHIELD | Admitting: Vascular Surgery

## 2015-04-21 VITALS — BP 128/87 | HR 81 | Temp 97.6°F | Resp 16 | Ht 65.0 in | Wt 185.0 lb

## 2015-04-21 DIAGNOSIS — I83891 Varicose veins of right lower extremities with other complications: Secondary | ICD-10-CM

## 2015-04-21 NOTE — Progress Notes (Signed)
Subjective:     Patient ID: Sarah Buchanan, female   DOB: 03-05-1951, 65 y.o.   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 She was seen by Dr.  Trula Slade 3 months ago. She has tried long-leg elastic compression stockings 20-30 millimeter gradient as well as elevation and ibuprofen with no success. He continues to have pain in the varicosities which worsens as the day progresses as does the edema in her right lower leg. She has no history of DVT or thrombophlebitis stasis ulcers or bleeding. Symptoms are affecting her daily living in continuing to get worse.  Past Medical History  Diagnosis Date  . Varicose veins     Social History  Substance Use Topics  . Smoking status: Never Smoker   . Smokeless tobacco: Never Used  . Alcohol Use: 0.6 oz/week    1 Glasses of wine per week    Family History  Problem Relation Age of Onset  . Dementia Mother   . Stroke Father   . Lymphoma Brother     No Known Allergies   Current outpatient prescriptions:  .  aspirin 81 MG tablet, Take 81 mg by mouth daily., Disp: , Rfl:  .  Calcium Citrate-Vitamin D (CALCIUM CITRATE + D3 PO), Take 630 mg by mouth daily., Disp: , Rfl:  .  calcium-vitamin D 250-100 MG-UNIT per tablet, Take 1 tablet by mouth 2 (two) times daily., Disp: , Rfl:  .  cholecalciferol (VITAMIN D) 1000 UNITS tablet, Take 1,000 Units by mouth daily., Disp: , Rfl:  .  Cholecalciferol (VITAMIN D-3 PO), Take 1,200 mg by mouth daily., Disp: , Rfl:  .  co-enzyme Q-10 50 MG capsule, Take 100 mg by mouth 2 (two) times daily., Disp: , Rfl:  .  erythromycin ophthalmic ointment, , Disp: , Rfl: 0 .  estradiol (VIVELLE-DOT) 0.05 MG/24HR patch, , Disp: , Rfl:  .  Flaxseed Oil OIL, 600 mg by Does not apply route daily., Disp: , Rfl:  .  levothyroxine (SYNTHROID, LEVOTHROID) 75 MCG tablet, Take 75 mcg by mouth daily before breakfast., Disp:  , Rfl:  .  Magnesium 300 MG CAPS, Take by mouth daily., Disp: , Rfl:  .  Multiple Vitamin (MULTIVITAMIN) capsule, Take 1 capsule by mouth daily., Disp: , Rfl:  .  omega-3 acid ethyl esters (LOVAZA) 1 G capsule, Take by mouth 2 (two) times daily., Disp: , Rfl:  .  Omega-3 Fatty Acids (FISH OIL) 1000 MG CAPS, Take 2,000 mg by mouth daily., Disp: , Rfl:  .  progesterone (PROMETRIUM) 100 MG capsule, , Disp: , Rfl:  .  traMADol (ULTRAM) 50 MG tablet, Take 1 tablet (50 mg total) by mouth 2 (two) times daily. (Patient not taking: Reported on 01/19/2015), Disp: 50 tablet, Rfl: 2  Filed Vitals:   04/21/15 1529  BP: 128/87  Pulse: 81  Temp: 97.6 F (36.4 C)  Resp: 16  Height: 5\' 5"  (1.651 m)  Weight: 185 lb (83.915 kg)  SpO2: 99%    Body mass index is 30.79 kg/(m^2).           Review of Systems Denies chest pain, dyspnea on exertion, PND, orthopnea, hemoptysis     Objective:   Physical Exam BP 128/87 mmHg  Pulse 81  Temp(Src) 97.6 F (36.4 C)  Resp 16  Ht 5\' 5"  (1.651 m)  Wt 185 lb (83.915 kg)  BMI 30.79 kg/m2  SpO2 99%  Gen. Well-developed well-nourished female no apparent distress alert and oriented 3 Lungs no rhonchi or wheezing Right leg with bulging varicosities in the medial distal thigh medial calf and posterior calf with 1+ edema distally. 3+ to Salas pedis pulse palpable.   today I reviewed the venous duplex exam which was performed at the last visit also performed an independent bedside sono site ultrasound exam. Patient has gross reflux throughout a large right great saphenous vein from the proximal calf to  And including the saphenofemoral junction with no DVT. This is supplying the painful varicosities     Assessment:      painful varicosities and distal edema from gross reflux right great saphenous vein. Symptoms are resistant to conservative measures including long way last compression stockings 20-30 mm gradient, elevation, and ibuprofen. Symptoms are  affecting patient's daily living     Plan:       Patient needs laser ablation right great saphenous vein plus greater than 20 stab phlebectomy of painful varicosities We will proceed with precertification fullness in the near future

## 2015-04-27 ENCOUNTER — Other Ambulatory Visit: Payer: Self-pay | Admitting: *Deleted

## 2015-04-27 DIAGNOSIS — I83811 Varicose veins of right lower extremities with pain: Secondary | ICD-10-CM

## 2015-05-25 ENCOUNTER — Encounter: Payer: Self-pay | Admitting: Vascular Surgery

## 2015-05-27 ENCOUNTER — Ambulatory Visit
Admission: RE | Admit: 2015-05-27 | Discharge: 2015-05-27 | Disposition: A | Discharge status: Home / Self Care | Payer: BLUE CROSS/BLUE SHIELD | Source: Ambulatory Visit

## 2015-05-27 DIAGNOSIS — Z1231 Encounter for screening mammogram for malignant neoplasm of breast: Secondary | ICD-10-CM

## 2015-05-29 ENCOUNTER — Encounter: Payer: Self-pay | Admitting: Vascular Surgery

## 2015-06-01 ENCOUNTER — Encounter: Payer: Self-pay | Admitting: Vascular Surgery

## 2015-06-01 ENCOUNTER — Ambulatory Visit (INDEPENDENT_AMBULATORY_CARE_PROVIDER_SITE_OTHER): Payer: BLUE CROSS/BLUE SHIELD | Admitting: Vascular Surgery

## 2015-06-01 DIAGNOSIS — I83891 Varicose veins of right lower extremities with other complications: Secondary | ICD-10-CM | POA: Diagnosis not present

## 2015-06-01 NOTE — Progress Notes (Signed)
Laser Ablation Procedure    Date: 06/01/2015   Sarah Buchanan DOB:[birth year]  Consent signed: Yes    Surgeon:  Dr. Nelda Severe. Kellie Simmering  Procedure: Laser Ablation: right Greater Saphenous Vein  There were no vitals taken for this visit.  Tumescent Anesthesia: 400 cc 0.9% NaCl with 50 cc Lidocaine HCL with 1% Epi and 15 cc 8.4% NaHCO3  Local Anesthesia: 9 cc Lidocaine HCL and NaHCO3 (ratio 2:1)  Pulsed Mode: 15 watts, 569ms delay, 1.0 duration  Total Energy: 2365             Total Pulses:  158              Total Time: 2:38    Stab Phlebectomy: >20 Sites: Calf  Patient tolerated procedure well  Notes:   Description of Procedure:  After marking the course of the secondary varicosities, the patient was placed on the operating table in the supine position, and the right leg was prepped and draped in sterile fashion.   Local anesthetic was administered and under ultrasound guidance the saphenous vein was accessed with a micro needle and guide wire; then the mirco puncture sheath was placed.  A guide wire was inserted saphenofemoral junction , followed by a 5 french sheath.  The position of the sheath and then the laser fiber below the junction was confirmed using the ultrasound.  Tumescent anesthesia was administered along the course of the saphenous vein using ultrasound guidance. The patient was placed in Trendelenburg position and protective laser glasses were placed on patient and staff, and the laser was fired at 15 watts continuous mode advancing 1-55mm/second for a total of 2365 joules.   For stab phlebectomies, local anesthetic was administered at the previously marked varicosities, and tumescent anesthesia was administered around the vessels.  Greater than 20 stab wounds were made using the tip of an 11 blade. And using the vein hook, the phlebectomies were performed using a hemostat to avulse the varicosities.  Adequate hemostasis was achieved.     Steri strips were applied to the  stab wounds and ABD pads and thigh high compression stockings were applied.  Ace wrap bandages were applied over the phlebectomy sites and at the top of the saphenofemoral junction. Blood loss was less than 15 cc.  The patient ambulated out of the operating room having tolerated the procedure well.

## 2015-06-01 NOTE — Progress Notes (Signed)
Subjective:     Patient ID: Sarah Buchanan, female   DOB: Oct 11, 1950, 65 y.o.   MRN: [id number]  HPI This 65 year old female had laser ablation of the right great saphenous vein plus greater than 20 stab phlebectomy of painful varicosities performed under local tumescent anesthesia. A total of 2365 J of energy was utilized. She tolerated the procedure well.   Review of Systems     Objective:   Physical Exam There were no vitals taken for this visit.       Assessment:      well-tolerated laser ablation right great saphenous vein from proximal calf to near the saphenofemoral junction performed under local tumescent anesthesia plus greater than 20 stab phlebectomy of painful varicosities.     Plan:      patient return in 1 week for venous duplex exam to confirm closure right great saphenous vein and this will complete her treatment regimen

## 2015-06-02 ENCOUNTER — Telehealth: Payer: Self-pay | Admitting: *Deleted

## 2015-06-02 ENCOUNTER — Encounter: Payer: Self-pay | Admitting: Vascular Surgery

## 2015-06-02 NOTE — Telephone Encounter (Signed)
Pt doing well. No bleeding. No pain. Following all instructions.

## 2015-06-08 ENCOUNTER — Ambulatory Visit (INDEPENDENT_AMBULATORY_CARE_PROVIDER_SITE_OTHER): Payer: Self-pay | Admitting: Vascular Surgery

## 2015-06-08 ENCOUNTER — Ambulatory Visit (HOSPITAL_COMMUNITY)
Admission: RE | Admit: 2015-06-08 | Discharge: 2015-06-08 | Disposition: A | Discharge status: Home / Self Care | Payer: BLUE CROSS/BLUE SHIELD | Source: Ambulatory Visit | Attending: Vascular Surgery | Admitting: Vascular Surgery

## 2015-06-08 ENCOUNTER — Encounter: Payer: Self-pay | Admitting: Vascular Surgery

## 2015-06-08 VITALS — BP 130/80 | HR 62 | Temp 98.1°F | Resp 18 | Ht 65.0 in | Wt 185.0 lb

## 2015-06-08 DIAGNOSIS — Z9889 Other specified postprocedural states: Secondary | ICD-10-CM | POA: Diagnosis present

## 2015-06-08 DIAGNOSIS — I83811 Varicose veins of right lower extremities with pain: Secondary | ICD-10-CM

## 2015-06-08 DIAGNOSIS — I83891 Varicose veins of right lower extremities with other complications: Secondary | ICD-10-CM

## 2015-06-08 NOTE — Progress Notes (Signed)
Subjective:     Patient ID: Sarah Buchanan, female   DOB: 06-09-1950, 65 y.o.   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 She has had mild discomfort along the course of the great saphenous vein. She did take her ibuprofen as instructed. She did wear elastic compression stocking as instructed. She has had no distal edema. She states that the calf feels much better already where the bulging varicosities were located which kept her from sleeping well at night.  Past Medical History  Diagnosis Date  . Varicose veins     Social History  Substance Use Topics  . Smoking status: Never Smoker   . Smokeless tobacco: Never Used  . Alcohol Use: 0.6 oz/week    1 Glasses of wine per week    Family History  Problem Relation Age of Onset  . Dementia Mother   . Stroke Father   . Lymphoma Brother     No Known Allergies   Current outpatient prescriptions:  .  aspirin 81 MG tablet, Take 81 mg by mouth daily., Disp: , Rfl:  .  Calcium Citrate-Vitamin D (CALCIUM CITRATE + D3 PO), Take 630 mg by mouth daily., Disp: , Rfl:  .  calcium-vitamin D 250-100 MG-UNIT per tablet, Take 1 tablet by mouth 2 (two) times daily., Disp: , Rfl:  .  cholecalciferol (VITAMIN D) 1000 UNITS tablet, Take 1,000 Units by mouth daily., Disp: , Rfl:  .  Cholecalciferol (VITAMIN D-3 PO), Take 1,200 mg by mouth daily., Disp: , Rfl:  .  co-enzyme Q-10 50 MG capsule, Take 100 mg by mouth 2 (two) times daily., Disp: , Rfl:  .  erythromycin ophthalmic ointment, , Disp: , Rfl: 0 .  estradiol (VIVELLE-DOT) 0.05 MG/24HR patch, , Disp: , Rfl:  .  ezetimibe (ZETIA) 10 MG tablet, Take 10 mg by mouth daily., Disp: , Rfl:  .  Flaxseed Oil OIL, 600 mg by Does not apply route daily., Disp: , Rfl:  .  levothyroxine (SYNTHROID, LEVOTHROID) 75 MCG tablet, Take 75 mcg by mouth daily before breakfast., Disp:  , Rfl:  .  Magnesium 300 MG CAPS, Take by mouth daily., Disp: , Rfl:  .  Multiple Vitamin (MULTIVITAMIN) capsule, Take 1 capsule by mouth daily., Disp: , Rfl:  .  omega-3 acid ethyl esters (LOVAZA) 1 G capsule, Take by mouth 2 (two) times daily., Disp: , Rfl:  .  Omega-3 Fatty Acids (FISH OIL) 1000 MG CAPS, Take 2,000 mg by mouth daily., Disp: , Rfl:  .  progesterone (PROMETRIUM) 100 MG capsule, , Disp: , Rfl:  .  traMADol (ULTRAM) 50 MG tablet, Take 1 tablet (50 mg total) by mouth 2 (two) times daily., Disp: 50 tablet, Rfl: 2  Filed Vitals:   06/08/15 1435  BP: 130/80  Pulse: 62  Temp: 98.1 F (36.7 C)  TempSrc: Oral  Resp: 18  Height: 5\' 5"  (1.651 m)  Weight: 185 lb (83.915 kg)  SpO2: 95%    Body mass index is 30.79 kg/(m^2).           Review of Systems Denies chest pain, dyspnea on exertion, PND, orthopnea, hemopysis     Objective:   Physical Exam BP 130/80 mmHg  Pulse 62  Temp(Src) 98.1 F (36.7 C) (Oral)  Resp 18  Ht 5\' 5"  (1.651 m)  Wt 185 lb (83.915 kg)  BMI 30.79 kg/m2  SpO2 95%  Gen. Well-developed well-nourished female no apparent distress alert and oriented 3 Lungs no rhonchi or wheezing Cardiovascular regular rhythm no murmurs Right leg with moderate ecchymosis in the medial and posterior thigh around the great saphenous vein area. Mild tenderness to deep palpation. No distal edema noted. Stab phlebectomy sites healing nicely and the calf. 3+ dorsalis pedis pulse palpable.   today I ordered a venous duplex exam of the right leg which I reviewed and interpreted. There is no DVT. There is total closure of the right great saphenous vein up to near the saphenofemoral junction.     Assessment:      successful laser ablation right great saphenous vein plus multiple stab phlebectomy of painful varicosities performed for gross reflux in right great saphenous system     Plan:      return to see me on when necessary basis

## 2015-10-06 DIAGNOSIS — G454 Transient global amnesia: Secondary | ICD-10-CM

## 2015-10-06 HISTORY — DX: Transient global amnesia: G45.4

## 2015-10-15 ENCOUNTER — Emergency Department (HOSPITAL_COMMUNITY)
Admission: EM | Admit: 2015-10-15 | Discharge: 2015-10-15 | Disposition: A | Discharge status: Home / Self Care | Payer: BLUE CROSS/BLUE SHIELD | Attending: Emergency Medicine | Admitting: Emergency Medicine

## 2015-10-15 ENCOUNTER — Emergency Department (HOSPITAL_COMMUNITY): Payer: BLUE CROSS/BLUE SHIELD

## 2015-10-15 ENCOUNTER — Encounter (HOSPITAL_COMMUNITY): Payer: Self-pay

## 2015-10-15 DIAGNOSIS — R791 Abnormal coagulation profile: Secondary | ICD-10-CM | POA: Diagnosis not present

## 2015-10-15 DIAGNOSIS — R410 Disorientation, unspecified: Secondary | ICD-10-CM | POA: Diagnosis not present

## 2015-10-15 DIAGNOSIS — G454 Transient global amnesia: Secondary | ICD-10-CM | POA: Diagnosis not present

## 2015-10-15 DIAGNOSIS — E039 Hypothyroidism, unspecified: Secondary | ICD-10-CM | POA: Diagnosis not present

## 2015-10-15 DIAGNOSIS — Z7982 Long term (current) use of aspirin: Secondary | ICD-10-CM | POA: Diagnosis not present

## 2015-10-15 DIAGNOSIS — R404 Transient alteration of awareness: Secondary | ICD-10-CM | POA: Insufficient documentation

## 2015-10-15 DIAGNOSIS — Z79899 Other long term (current) drug therapy: Secondary | ICD-10-CM | POA: Diagnosis not present

## 2015-10-15 DIAGNOSIS — R4182 Altered mental status, unspecified: Secondary | ICD-10-CM | POA: Diagnosis present

## 2015-10-15 LAB — CBC
HCT: 46.5 % — ABNORMAL HIGH (ref 36.0–46.0)
Hemoglobin: 15.5 g/dL — ABNORMAL HIGH (ref 12.0–15.0)
MCH: 31 pg (ref 26.0–34.0)
MCHC: 33.3 g/dL (ref 30.0–36.0)
MCV: 93 fL (ref 78.0–100.0)
Platelets: 206 10*3/uL (ref 150–400)
RBC: 5 MIL/uL (ref 3.87–5.11)
RDW: 13 % (ref 11.5–15.5)
WBC: 7 10*3/uL (ref 4.0–10.5)

## 2015-10-15 LAB — I-STAT CHEM 8, ED
BUN: 25 mg/dL — ABNORMAL HIGH (ref 6–20)
Calcium, Ion: 1.21 mmol/L (ref 1.12–1.23)
Chloride: 103 mmol/L (ref 101–111)
Creatinine, Ser: 0.8 mg/dL (ref 0.44–1.00)
Glucose, Bld: 101 mg/dL — ABNORMAL HIGH (ref 65–99)
HCT: 47 % — ABNORMAL HIGH (ref 36.0–46.0)
Hemoglobin: 16 g/dL — ABNORMAL HIGH (ref 12.0–15.0)
Potassium: 4.4 mmol/L (ref 3.5–5.1)
Sodium: 141 mmol/L (ref 135–145)
TCO2: 29 mmol/L (ref 0–100)

## 2015-10-15 LAB — PROTIME-INR
INR: 0.94
Prothrombin Time: 12.5 seconds (ref 11.4–15.2)

## 2015-10-15 LAB — COMPREHENSIVE METABOLIC PANEL
ALT: 21 U/L (ref 14–54)
AST: 23 U/L (ref 15–41)
Albumin: 4 g/dL (ref 3.5–5.0)
Alkaline Phosphatase: 70 U/L (ref 38–126)
Anion gap: 8 (ref 5–15)
BUN: 19 mg/dL (ref 6–20)
CO2: 28 mmol/L (ref 22–32)
Calcium: 9.5 mg/dL (ref 8.9–10.3)
Chloride: 103 mmol/L (ref 101–111)
Creatinine, Ser: 0.79 mg/dL (ref 0.44–1.00)
GFR calc Af Amer: >60 mL/min (ref >60)
GFR calc non Af Amer: >60 mL/min (ref >60)
Glucose, Bld: 101 mg/dL — ABNORMAL HIGH (ref 65–99)
Potassium: 4.3 mmol/L (ref 3.5–5.1)
Sodium: 139 mmol/L (ref 135–145)
Total Bilirubin: 0.7 mg/dL (ref 0.3–1.2)
Total Protein: 7.3 g/dL (ref 6.5–8.1)

## 2015-10-15 LAB — DIFFERENTIAL
Basophils Absolute: 0 10*3/uL (ref 0.0–0.1)
Basophils Relative: 0 %
Eosinophils Absolute: 0.2 10*3/uL (ref 0.0–0.7)
Eosinophils Relative: 2 %
Lymphocytes Relative: 42 %
Lymphs Abs: 2.9 10*3/uL (ref 0.7–4.0)
Monocytes Absolute: 0.5 10*3/uL (ref 0.1–1.0)
Monocytes Relative: 7 %
Neutro Abs: 3.4 10*3/uL (ref 1.7–7.7)
Neutrophils Relative %: 49 %

## 2015-10-15 LAB — APTT: aPTT: 24 seconds (ref 24–36)

## 2015-10-15 LAB — I-STAT TROPONIN, ED: Troponin i, poc: 0 ng/mL (ref 0.00–0.08)

## 2015-10-15 NOTE — ED Notes (Signed)
Pt transported to MRI 

## 2015-10-15 NOTE — ED Notes (Signed)
Pt returned from MRI °

## 2015-10-15 NOTE — ED Triage Notes (Signed)
Pt. Stated, I went to Yoga and after I could not remember the date and very disoriented. Unable to give me the time of day or date. Pt. Told me the president was correct. Able to tell me the year. Oriented to her name. Confused with date and time. Stated around noon.  Time lapse , and just sitting in the parking lot and just lost time.

## 2015-10-15 NOTE — ED Provider Notes (Signed)
[facility] DEPT Provider Note   CSN: [id number] Arrival date & time: 10/15/15  1351  First Provider Contact:  First MD Initiated Contact with Patient 10/15/15 1416      An emergency department physician performed an initial assessment on this suspected stroke patient at 1408 (Krystel Fletchall).  History   Chief Complaint Chief Complaint  Patient presents with  . Altered Mental Status    HPI Sarah Buchanan is a 65 y.o. female.  The history is provided by the patient.  Altered Mental Status   This is a new problem. The current episode started 3 to 5 hours ago (sitting in parking lot for an hour after yoga, spoke to daughter by phone calling multiple times with repetitive speech perseverating about yoga. did not know day of week or other basic information.). The problem has not changed since onset.Associated symptoms include confusion. Associated symptoms comments: Felt "foggy". Her past medical history does not include diabetes, seizures, CVA, TIA, hypertension or dementia.    Past Medical History:  Diagnosis Date  . Varicose veins     Patient Active Problem List   Diagnosis Date Noted  . Varicose veins of right lower extremity with complications [id number]  . Patellofemoral arthritis of right knee 11/06/2013  . Right medial knee pain 09/26/2013  . BUNIONETTE 05/09/2007  . UNEQUAL LEG LENGTH 05/09/2007  . HYPOTHYROIDISM 05/02/2007  . METATARSALGIA 05/02/2007  . PLANTAR FASCIITIS, BILATERAL 05/02/2007    History reviewed. No pertinent surgical history.  OB History    No data available       Home Medications    Prior to Admission medications   Medication Sig Start Date End Date Taking? Authorizing Provider  aspirin 81 MG tablet Take 81 mg by mouth daily.    Historical Provider, MD  Calcium Citrate-Vitamin D (CALCIUM CITRATE + D3 PO) Take 630 mg by mouth daily.    Historical Provider, MD  calcium-vitamin D 250-100 MG-UNIT per tablet Take 1 tablet by mouth 2 (two) times  daily.    Historical Provider, MD  cholecalciferol (VITAMIN D) 1000 UNITS tablet Take 1,000 Units by mouth daily.    Historical Provider, MD  Cholecalciferol (VITAMIN D-3 PO) Take 1,200 mg by mouth daily.    Historical Provider, MD  co-enzyme Q-10 50 MG capsule Take 100 mg by mouth 2 (two) times daily.    Historical Provider, MD  erythromycin ophthalmic ointment  01/06/14   Historical Provider, MD  estradiol (VIVELLE-DOT) 0.05 MG/24HR patch  09/24/13   Historical Provider, MD  ezetimibe (ZETIA) 10 MG tablet Take 10 mg by mouth daily.    Historical Provider, MD  Flaxseed Oil OIL 600 mg by Does not apply route daily.    Historical Provider, MD  levothyroxine (SYNTHROID, LEVOTHROID) 75 MCG tablet Take 75 mcg by mouth daily before breakfast.    Historical Provider, MD  Magnesium 300 MG CAPS Take by mouth daily.    Historical Provider, MD  Multiple Vitamin (MULTIVITAMIN) capsule Take 1 capsule by mouth daily.    Historical Provider, MD  omega-3 acid ethyl esters (LOVAZA) 1 G capsule Take by mouth 2 (two) times daily.    Historical Provider, MD  Omega-3 Fatty Acids (FISH OIL) 1000 MG CAPS Take 2,000 mg by mouth daily.    Historical Provider, MD  progesterone (PROMETRIUM) 100 MG capsule  09/03/13   Historical Provider, MD  traMADol (ULTRAM) 50 MG tablet Take 1 tablet (50 mg total) by mouth 2 (two) times daily. 09/26/13   Stefanie Libel, MD  Family History Family History  Problem Relation Age of Onset  . Dementia Mother   . Stroke Father   . Lymphoma Brother     Social History Social History  Substance Use Topics  . Smoking status: Never Smoker  . Smokeless tobacco: Never Used  . Alcohol use 0.6 oz/week    1 Glasses of wine per week     Allergies   Review of patient's allergies indicates no known allergies.   Review of Systems Review of Systems  Psychiatric/Behavioral: Positive for confusion.  All other systems reviewed and are negative.    Physical Exam Updated Vital Signs BP  163/83 (BP Location: Left Arm)   Pulse 64   Temp 98.6 F (37 C) (Oral)   Resp 12   Ht 5' 6.5" (1.689 m)   Wt 190 lb (86.2 kg)   SpO2 98%   BMI 30.21 kg/m   Physical Exam  Constitutional: She is oriented to person, place, and time. Vital signs are normal. She appears well-developed and well-nourished. No distress.  HENT:  Head: Normocephalic.  Mouth/Throat: Uvula is midline.  Eyes: Conjunctivae are normal. Pupils are equal, round, and reactive to light.  Neck: Neck supple. No tracheal deviation present.  Cardiovascular: Normal rate and regular rhythm.   Pulmonary/Chest: Effort normal. No respiratory distress.  Abdominal: Soft. She exhibits no distension.  Neurological: She is alert and oriented to person, place, and time. She has normal strength. No cranial nerve deficit. Coordination normal. GCS eye subscore is 4. GCS verbal subscore is 5. GCS motor subscore is 6.  Normal finger to nose testing and rapid alternating movement   Skin: Skin is warm and dry.  Psychiatric: She has a normal mood and affect.  Initially repetitive speech, resolved on repeat examination     ED Treatments / Results  Labs (all labs ordered are listed, but only abnormal results are displayed) Labs Reviewed  CBC - Abnormal; Notable for the following:       Result Value   Hemoglobin 15.5 (*)    HCT 46.5 (*)    All other components within normal limits  COMPREHENSIVE METABOLIC PANEL - Abnormal; Notable for the following:    Glucose, Bld 101 (*)    All other components within normal limits  I-STAT CHEM 8, ED - Abnormal; Notable for the following:    BUN 25 (*)    Glucose, Bld 101 (*)    Hemoglobin 16.0 (*)    HCT 47.0 (*)    All other components within normal limits  PROTIME-INR  APTT  DIFFERENTIAL  I-STAT TROPOININ, ED  CBG MONITORING, ED    EKG  EKG Interpretation  Date/Time:  Thursday October 15 2015 14:20:24 EDT Ventricular Rate:  67 PR Interval:    QRS Duration: 102 QT  Interval:  443 QTC Calculation: 468 R Axis:   64 Text Interpretation:  Sinus rhythm Nonspecific T wave abnormality Otherwise normal ECG No previous tracing Confirmed by Mikki Ziff MD, Raima Geathers [phone]) on 10/15/2015 2:37:50 PM       Radiology Mr Brain Wo Contrast  Result Date: 10/15/2015 CLINICAL DATA:  Confusion.  Transient global amnesia. EXAM: MRI HEAD WITHOUT CONTRAST TECHNIQUE: Multiplanar, multiecho pulse sequences of the brain and surrounding structures were obtained without intravenous contrast. COMPARISON:  CT head 10/15/2015 FINDINGS: Ventricle size normal.  Cerebral volume normal. Punctate focus of hyperintensity in the left lateral hippocampus measuring approximately 2 mm. This is subtle but appears to be confirmed on thin-section axial diffusion. This has been described in  total global amnesia. It is not known if this represents acute infarct or other phenomena from total global amnesia. No other areas of restricted diffusion. No evidence of chronic ischemia. White matter normal. Brainstem and cerebellum normal. Negative for hemorrhage or fluid collection. Negative for mass or edema.  No shift of the midline structures. Circle of Willis appears patent. Normal orbit. Paranasal sinuses clear. Pituitary not enlarged. Negative calvarium. IMPRESSION: Punctate focus of hyperintensity on diffusion-weighted imaging in the left lateral hippocampus. This is a subtle finding. This has been described with total global amnesia. American Journal of Neuroradiology August 2008, 29 (7) [id number] Otherwise within normal limits. Electronically Signed   By: Franchot Gallo M.D.   On: 10/15/2015 16:32   Ct Head Code Stroke W/o Cm  Result Date: 10/15/2015 CLINICAL DATA:  Code stroke.  Disoriented.  Confusion. EXAM: CT HEAD WITHOUT CONTRAST TECHNIQUE: Contiguous axial images were obtained from the base of the skull through the vertex without intravenous contrast. COMPARISON:  None. FINDINGS: Ventricle size normal.   Cerebral volume normal. Negative for acute or chronic infarction. Negative for hemorrhage or mass lesion. No edema or shift of the midline structures. Normal calvarium. ASPECTS [facility] Stroke Program Early CT Score, [email or web address]) - Ganglionic level infarction (caudate, lentiform nuclei, internal capsule, insula, M1-M3 cortex): 7 - Supraganglionic infarction (M4-M6 cortex): 3 Total score (0-10 with 10 being normal): 10 IMPRESSION: 1. Negative CT of the head 2. ASPECTS score 10 These results were called by telephone at the time of interpretation on 10/15/2015 at 2:26 pm to Dr. Tasia Catchings, who verbally acknowledged these results. Electronically Signed   By: Franchot Gallo M.D.   On: 10/15/2015 14:26    Procedures Procedures (including critical care time)  Medications Ordered in ED Medications - No data to display   Initial Impression / Assessment and Plan / ED Course  I have reviewed the triage vital signs and the nursing notes.  Pertinent labs & imaging results that were available during my care of the patient were reviewed by me and considered in my medical decision making (see chart for details).  Clinical Course    65 year old female presents with signs and symptoms that are concerning for TIA versus transient global amnesia. She was in the parking lot after a new class today and was sitting in the car. She did not know what she was supposed to be doing, did not know the day of the week, had a conversation with her daughter that she does not remember, and called a friend whose house she drove to and on arrival was perseverating about urinary class and not making much sense to them but otherwise was intact neurologically. She remained slightly confused on arrival here. Code stroke was activated due to onset of symptoms within the window for intervention, CT was negative, neurology recommending MRI emergently to rule out acute ischemic pathology. Following MRI I reexamined the  patient who had completely returned to baseline and had all of her faculties with no neurologic deficits. Her family history is concerning for episodes of transient global amnesia in her younger brother which they had previously attributed to head injuries in football.   Neurology recommending follow-up of MRI and plan to discharge if negative. Dr Johnney Killian to review MR with neurology and likely d/c with OP f/u.   Final Clinical Impressions(s) / ED Diagnoses   Final diagnoses:  Confusion  Transient alteration of awareness  TGA (transient global amnesia)    New Prescriptions New Prescriptions   No medications on  file     Leo Grosser, MD 10/15/15 [phone]

## 2015-10-15 NOTE — Consult Note (Signed)
Initial Neurological Consultation                      NEURO HOSPITALIST CONSULT NOTE   Requestig physician: Dr. Laneta Simmers   Reason for Consult:  Episode of confusion.   HPI:                                                                                                                                          Sarah Buchanan is an 65 y.o. female with no significant past medical history who presents with an episode of confusion and disorientation. She presents today with a friend who notes that after she had performed her yoga class she was speaking to him and indicated multiple times that she needed to go to her yoga class. She repeatedly seemed confused as to her purpose for today and her present location. She called her daughter several times but thought that she had only called her wants. She was noted to ask the same questions repeatedly and to make statements such as "what are we doing today". There is no prior history of migraine. There is also no prior history of episodes of this nature.     Past Medical History:  Diagnosis Date  . Varicose veins     History reviewed. No pertinent surgical history.  MEDICATIONS:                                                                                                                     I have reviewed the patient's current medications.  No Known Allergies   Social History:  reports that she has never smoked. She has never used smokeless tobacco. She reports that she drinks about 0.6 oz of alcohol per week . She reports that she does not use drugs.  Family History  Problem Relation Age of Onset  . Dementia Mother   . Stroke Father   . Lymphoma Brother      ROS:  History obtained from chart review  General ROS: negative for - chills, fatigue, fever, night sweats, weight gain or weight  loss Psychological ROS: negative for - behavioral disorder, hallucinations, memory difficulties, mood swings or suicidal ideation Ophthalmic ROS: negative for - blurry vision, double vision, eye pain or loss of vision ENT ROS: negative for - epistaxis, nasal discharge, oral lesions, sore throat, tinnitus or vertigo Allergy and Immunology ROS: negative for - hives or itchy/watery eyes Hematological and Lymphatic ROS: negative for - bleeding problems, bruising or swollen lymph nodes Endocrine ROS: negative for - galactorrhea, hair pattern changes, polydipsia/polyuria or temperature intolerance Respiratory ROS: negative for - cough, hemoptysis, shortness of breath or wheezing Cardiovascular ROS: negative for - chest pain, dyspnea on exertion, edema or irregular heartbeat Gastrointestinal ROS: negative for - abdominal pain, diarrhea, hematemesis, nausea/vomiting or stool incontinence Genito-Urinary ROS: negative for - dysuria, hematuria, incontinence or urinary frequency/urgency Musculoskeletal ROS: negative for - joint swelling or muscular weakness Neurological ROS: as noted in HPI Dermatological ROS: negative for rash and skin lesion changes   General Exam                                                                                                      Blood pressure 163/83, pulse 64, temperature 98.6 F (37 C), temperature source Oral, resp. rate 12, height 5' 6.5" (1.689 m), weight 86.2 kg (190 lb), SpO2 98 %. HEENT-  Normocephalic, no lesions, without obvious abnormality.  Normal external eye and conjunctiva.  Normal TM's bilaterally.  Normal auditory canals and external ears. Normal external nose, mucus membranes and septum.  Normal pharynx. Cardiovascular- regular rate and rhythm, S1, S2 normal, no murmur, click, rub or gallop, pulses palpable throughout   Lungs- chest clear, no wheezing, rales, normal symmetric air entry, Heart exam - S1, S2 normal, no murmur, no gallop, rate  regular Abdomen- soft, non-tender; bowel sounds normal; no masses,  no organomegaly Extremities- less then 2 second capillary refill Lymph-no adenopathy palpable Musculoskeletal-no joint tenderness, deformity or swelling Skin-warm and dry, no hyperpigmentation, vitiligo, or suspicious lesions  Neurological Examination Mental Status: Alert, oriented, thought content appropriate.  Speech fluent without evidence of aphasia.  Able to follow 3 step commands without difficulty. Cranial Nerves: II: Discs flat bilaterally; Visual fields grossly normal, pupils equal, round, reactive to light and accommodation III,IV, VI: ptosis not present, extra-ocular motions intact bilaterally V,VII: smile symmetric, facial light touch sensation normal bilaterally VIII: hearing normal bilaterally IX,X: uvula rises symmetrically XI: bilateral shoulder shrug XII: midline tongue extension Motor: Right : Upper extremity   5/5    Left:     Upper extremity   5/5  Lower extremity   5/5     Lower extremity   5/5 Tone and bulk:normal tone throughout; no atrophy noted Sensory: Pinprick and light touch intact throughout, bilaterally Deep Tendon Reflexes: 2+ and symmetric throughout Plantars: Right: downgoing   Left: downgoing Cerebellar: normal finger-to-nose, normal rapid alternating movements and normal heel-to-shin test Gait: normal gait and station      Lab Results: Basic Metabolic Panel:  Recent Labs  Lab 10/15/15 1416  NA 141  K 4.4  CL 103  GLUCOSE 101*  BUN 25*  CREATININE 0.80    Liver Function Tests: No results for input(s): AST, ALT, ALKPHOS, BILITOT, PROT, ALBUMIN in the last 168 hours. No results for input(s): LIPASE, AMYLASE in the last 168 hours. No results for input(s): AMMONIA in the last 168 hours.  CBC:  Recent Labs Lab 10/15/15 1416  HGB 16.0*  HCT 47.0*    Cardiac Enzymes: No results for input(s): CKTOTAL, CKMB, CKMBINDEX, TROPONINI in the last 168 hours.  Lipid  Panel: No results for input(s): CHOL, TRIG, HDL, CHOLHDL, VLDL, LDLCALC in the last 168 hours.  CBG: No results for input(s): GLUCAP in the last 168 hours.  Microbiology: No results found for this or any previous visit.  Coagulation Studies: No results for input(s): LABPROT, INR in the last 72 hours.  Imaging: Ct Head Code Stroke W/o Cm  Result Date: 10/15/2015 CLINICAL DATA:  Code stroke.  Disoriented.  Confusion. EXAM: CT HEAD WITHOUT CONTRAST TECHNIQUE: Contiguous axial images were obtained from the base of the skull through the vertex without intravenous contrast. COMPARISON:  None. FINDINGS: Ventricle size normal.  Cerebral volume normal. Negative for acute or chronic infarction. Negative for hemorrhage or mass lesion. No edema or shift of the midline structures. Normal calvarium. ASPECTS [facility] Stroke Program Early CT Score, http://www.aspectsinstroke.com) - Ganglionic level infarction (caudate, lentiform nuclei, internal capsule, insula, M1-M3 cortex): 7 - Supraganglionic infarction (M4-M6 cortex): 3 Total score (0-10 with 10 being normal): 10 IMPRESSION: 1. Negative CT of the head 2. ASPECTS score 10 These results were called by telephone at the time of interpretation on 10/15/2015 at 2:26 pm to Dr. Tasia Catchings, who verbally acknowledged these results. Electronically Signed   By: Franchot Gallo M.D.   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 She presents today with a history of an episode of confusion and disorientation. At the present time she is back to her baseline. CT of the brain was normal. The presentation appears most consistent with an episode of transient global amnesia. We will obtain MRI to rule out any other potential abnormalities.  Plan:  1. MRI of brain.  2. If the MRI is normal Dr. Hardin Negus can be discharged home.    James A. Tasia Catchings, M.D. Neurohospitalist Phone: [phone]  10/15/2015, 2:47  PM

## 2015-10-15 NOTE — ED Provider Notes (Signed)
I have except the patient signout from Dr. Laneta Simmers. We have reviewed the history present illness and patient condition. Patient symptoms have completely resolved. MRI results reviewed with neurology for consult and on the patient for code stroke evaluation. Dr. Tasia Catchings reviewed and advises this is consistent with TGA.  MRI results and diagnosis of TGA reviewed with patient. Patient is counseled to follow-up with her PCP for recheck. She is counseled for immediate return if any neurologic symptoms recur or new symptoms develop.   Charlesetta Shanks, MD 10/15/15 [phone]

## 2015-10-15 NOTE — ED Notes (Signed)
Code stroke canceled by Dr. Tasia Catchings

## 2015-10-15 NOTE — Code Documentation (Signed)
65yo female arriving to [facility] via private vehicle at 1351.  Patient went to yoga class this morning and after class was noted to have confusion around 1200.  Patient called her daughter and was sitting in her car in the parking lot where the yoga class is located.  Per patient's friend at the bedside, patient was asking repetitive questions.  Code stroke called on patient arrival.  Patient to CT.  Stroke team to the bedside.  NIHSS 1, see documentation for details and code stroke times.  Patient initially unable to state the month but later able to state month correctly.  Dr. Tasia Catchings at the bedside.  Patient with likely transient global amnesia per MD.  No acute stroke treatment at this time.  Code stroke canceled.  Bedside handoff with ED RN Margreta Journey.

## 2015-10-15 NOTE — ED Notes (Signed)
CODE STROKE ACTIVATED; ACTIVATED WITH CARELINK @ 14:02PM

## 2015-10-27 ENCOUNTER — Other Ambulatory Visit (HOSPITAL_COMMUNITY): Payer: Self-pay | Admitting: Internal Medicine

## 2015-10-27 ENCOUNTER — Ambulatory Visit (HOSPITAL_COMMUNITY)
Admission: RE | Admit: 2015-10-27 | Discharge: 2015-10-27 | Disposition: A | Discharge status: Home / Self Care | Payer: BLUE CROSS/BLUE SHIELD | Source: Ambulatory Visit | Attending: Vascular Surgery | Admitting: Vascular Surgery

## 2015-10-27 DIAGNOSIS — G454 Transient global amnesia: Secondary | ICD-10-CM | POA: Diagnosis not present

## 2015-10-27 LAB — VAS US CAROTID
LEFT ECA DIAS: -17 cm/s
Left CCA dist dias: -26 cm/s
Left CCA dist sys: -78 cm/s
Left CCA prox dias: 28 cm/s
Left CCA prox sys: 104 cm/s
Left ICA dist dias: -27 cm/s
Left ICA dist sys: -72 cm/s
Left ICA prox dias: 17 cm/s
Left ICA prox sys: 63 cm/s
RIGHT CCA MID DIAS: 24 cm/s
RIGHT ECA DIAS: -22 cm/s
Right CCA prox dias: 23 cm/s
Right CCA prox sys: 84 cm/s
Right cca dist sys: -68 cm/s

## 2015-11-02 ENCOUNTER — Other Ambulatory Visit: Payer: Self-pay

## 2015-11-02 ENCOUNTER — Ambulatory Visit (HOSPITAL_COMMUNITY): Payer: BLUE CROSS/BLUE SHIELD | Attending: Cardiology

## 2015-11-02 ENCOUNTER — Other Ambulatory Visit (HOSPITAL_COMMUNITY): Payer: Self-pay | Admitting: Internal Medicine

## 2015-11-02 DIAGNOSIS — G459 Transient cerebral ischemic attack, unspecified: Secondary | ICD-10-CM | POA: Insufficient documentation

## 2015-11-02 DIAGNOSIS — E039 Hypothyroidism, unspecified: Secondary | ICD-10-CM | POA: Insufficient documentation

## 2015-11-02 DIAGNOSIS — I517 Cardiomegaly: Secondary | ICD-10-CM | POA: Diagnosis not present

## 2015-11-05 ENCOUNTER — Ambulatory Visit (INDEPENDENT_AMBULATORY_CARE_PROVIDER_SITE_OTHER): Payer: BLUE CROSS/BLUE SHIELD | Admitting: Neurology

## 2015-11-05 ENCOUNTER — Encounter: Payer: Self-pay | Admitting: Neurology

## 2015-11-05 VITALS — BP 119/78 | HR 80 | Ht 65.0 in | Wt 193.6 lb

## 2015-11-05 DIAGNOSIS — G454 Transient global amnesia: Secondary | ICD-10-CM | POA: Diagnosis not present

## 2015-11-05 NOTE — Patient Instructions (Addendum)
I had a long discussion the patient with regards to her transient episodes of memory loss and confusion which likely represents transient global amnesia. I discussed the risk of recurrence and long-term sequelae being negligible as this usually is a benign condition. Continue aspirin for stroke prevention.Check EEG as it has not been done. I do not believe f/u MRI is indicated. She will return for follow-up in 6 months or call me earlier if needed.  Transient Global Amnesia Transient global amnesia causes a sudden and temporary (transient) loss of memory (amnesia). While you may recall memories from your distant past, including being able to recognize people you know well, you may not recall things that happened more recently in the past days, months, or even year. A transient global amnesia episode does not last longer than 24 hours.  Transient global amnesia does not affect your other brain functions. Your memory usually returns to normal after an episode is over. One episode of transient global amnesia does not make you more likely to have a stroke, a relapse, or other complications.  CAUSES  The cause of this condition is not known.  RISK FACTORS  Transient global amnesia is more likely to develop in people who:  Are 66-40 years old.  Have a history of migraine headaches. SYMPTOMS  The main symptoms of this condition include:  The inability to remember recent events.  Asking repetitive questions about the situation and surroundings and not recalling the answers to these questions. Other symptoms include:  Restlessness and nervousness.  Confusion.  Headaches.  Dizziness.  Nausea. DIAGNOSIS  Your health care provider may suspect transient global amnesia based on your symptoms. Your health care provider will do a physical exam. This may include a test to check your mental abilities (cognitive evaluation). You may also have imaging studies done to check your brain function. These may  include:   Electroencephalography (EEG).  Diffusion-weighted imaging (DWI).  MRI. TREATMENT  There is no treatment for this condition. An episode typically goes away on its own after a few hours. If you also have a seizure or migraine during an episode, you will receive treatment for these conditions. This may include medicines. HOME CARE INSTRUCTIONS  Take medicines only as directed by your health care provider.  Tell your family or friends that you have transient global amnesia. Ask them to help you avoid physical exertion, including sexual intercourse, swimming, and straining while holding your breath (Valsalva maneuver), until the episode passes. These are events that can bring on transient global amnesia attacks. SEEK MEDICAL CARE IF:   You have a migraine and it does not go away after you have followed your treatment plan for this condition.  You have a seizure for the first time, or a seizure that is different from seizures you normally have.  You experience transient global amnesia repeatedly.   This information is not intended to replace advice given to you by your health care provider. Make sure you discuss any questions you have with your health care provider.   Document Released: 03/31/2004 Document Revised: 11/12/2014 Document Reviewed: 11/06/2013 Elsevier Interactive Patient Education [employer].

## 2015-11-05 NOTE — Progress Notes (Signed)
[facility] [address] [place]. [place] [address] [phone]       OFFICE CONSULT NOTE  Ms. 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 Patient is able to recall only bits and pieces. She states she went for her regularly oh and exercise and as she walked to the parking lot she while thinking through herself cannot remember the day or the season. She had a large appointment with her friend and she must have called them as well as texted her son and daughter. Somehow she was able to drive and make it to a friend's house. There she was found to be confused and kept on repeating herself and asking the same question over and over again. Her friend brought her to the emergency room with a code stroke was called. CT scan of the head on admission was unremarkable. She was seen by the  neurologist on call Dr. Tasia Catchings where she had transient global amnesia. MRI scan of the brain was obtained with thin sections thru medial temporal lobes which  I have personally reviewed and showed punctate diffusion hyperintensity in the hippocampus which has been described in patients with transient global amnesia. Patient was discharged home and states she's had no problems with making new memories and recall the event is still patchy and foggy. She has no known history of stroke, TIA, seizures, migraine. Interestingly she does have a family history of transient global amnesia in her dad and also possibly a brother. She does have family history of Alzheimer's and her mother is going to die soon and patient is dealing with this and is single Orthoptist. She denies any headache, gait or balance problems. She was previously on aspirin and  was asked to change to Plavix by her primary physician but has not yet filled the prescription.  ROS:   14 system review of systems is positive for blurred vision, memory loss, confusion, depression and all other systems negative PMH:  Past Medical History:  Diagnosis Date  . Varicose veins     Social History:  Social History   Social History  . Marital status: Divorced    Spouse name: N/A  . Number of children: N/A  . Years of education: N/A   Occupational History  . Not on file.   Social History Main Topics  . Smoking status: Never Smoker  . Smokeless tobacco: Never Used  . Alcohol use 0.6 oz/week    1 Glasses of wine per week  . Drug use: No  . Sexual activity: Not on file   Other Topics Concern  . Not on file   Social History Narrative  . No narrative on file    Medications:   Current Outpatient Prescriptions on File Prior to Visit  Medication Sig Dispense Refill  . aspirin 81 MG tablet Take 81 mg by mouth daily.    . cholecalciferol (VITAMIN D) 1000 UNITS tablet Take 2,000 Units by mouth daily.     . Cholecalciferol (VITAMIN D-3 PO) Take 1,200 mg by mouth daily.    Marland Kitchen co-enzyme Q-10 50 MG capsule Take 300 mg by mouth 2 (two) times daily.     Marland Kitchen estradiol (VIVELLE-DOT) 0.05 MG/24HR patch     . Flaxseed Oil OIL 1,400  mg by Does not apply route daily.     Marland Kitchen levothyroxine (SYNTHROID, LEVOTHROID) 75 MCG tablet Take 88 mcg by mouth daily before breakfast.     . Magnesium 300 MG CAPS Take 400 mg by mouth daily.     . Multiple Vitamin (MULTIVITAMIN) capsule Take 1 capsule by mouth daily.    . Omega-3 Fatty Acids (FISH OIL) 1000 MG CAPS Take 2,000 mg by mouth daily.    . progesterone (PROMETRIUM) 100 MG capsule      No current facility-administered medications on file prior to visit.     Allergies:  Not on File  Physical Exam General: well developed, well nourished 65 year Caucasian lady, seated, in no evident distress Head: head normocephalic and atraumatic.     Neck: supple with no carotid or supraclavicular bruits Cardiovascular: regular rate and rhythm, no murmurs Musculoskeletal: no deformity Skin:  no rash/petichiae Vascular:  Normal pulses all extremities  Neurologic Exam Mental Status: Awake and fully alert. Oriented to place and time. Recent and remote memory intact. Attention span, concentration and fund of knowledge appropriate. Mood and affect appropriate. Diminished recall 2/3. Animal naming 15. Cranial Nerves: Fundoscopic exam reveals sharp disc margins. Pupils equal, briskly reactive to light. Extraocular movements full without nystagmus. Visual fields full to confrontation. Hearing intact. Facial sensation intact. Face, tongue, palate moves normally and symmetrically.  Motor: Normal bulk and tone. Normal strength in all tested extremity muscles. Sensory.: intact to touch , pinprick , position and vibratory sensation.  Coordination: Rapid alternating movements normal in all extremities. Finger-to-nose and heel-to-shin performed accurately bilaterally. Gait and Station: Arises from chair without difficulty. Stance is normal. Gait demonstrates normal stride length and balance . Able to heel, toe and tandem walk without difficulty.  Reflexes: 1+ and symmetric. Toes downgoing.      ASSESSMENT: 65 year old Caucasian lady with transient episode of short-term memory loss and confusion quite suggestive of transient global amnesia. Complex partial seizure, posterior separation TIA complicated migraine would be less likely.    PLAN: I had a long discussion the patient with regards to her transient episodes of memory loss and confusion which likely represents transient global amnesia. I discussed the risk of recurrence and long-term sequelae being negligible as this usually is a benign condition. Continue aspirin for stroke prevention.Check EEG as it has not been done. I do not believe f/u MRI is indicated. Greater than 50% time during this 45  minute visit was spent on counseling and coordination of care about transient global amnesia. She will return for follow-up in 6 months or call me earlier if needed. Antony Contras, MD  [facility] [address] [place], [place] [address]  Phone [phone] Fax [phone] Note: This document was prepared with digital dictation and possible smart phrase technology. Any transcriptional errors that result from this process are unintentional.

## 2015-11-30 ENCOUNTER — Ambulatory Visit (INDEPENDENT_AMBULATORY_CARE_PROVIDER_SITE_OTHER): Payer: BLUE CROSS/BLUE SHIELD | Admitting: Diagnostic Neuroimaging

## 2015-11-30 DIAGNOSIS — G454 Transient global amnesia: Secondary | ICD-10-CM

## 2015-11-30 DIAGNOSIS — R410 Disorientation, unspecified: Secondary | ICD-10-CM | POA: Diagnosis not present

## 2015-12-08 NOTE — Procedures (Signed)
   [facility]  EEG (ELECTROENCEPHALOGRAM) REPORT   STUDY DATE: 11/30/15 PATIENT NAME: Sarah Buchanan DOB: Jan 24, 1951 MRN: [id number]  ORDERING CLINICIAN: Antony Contras, MD   TECHNOLOGIST: Laretta Alstrom  TECHNIQUE: Electroencephalogram was recorded utilizing standard 10-20 system of lead placement and reformatted into average and bipolar montages.  RECORDING TIME: 24 minutes ACTIVATION: hyperventilation and photic stimulation  CLINICAL INFORMATION: 65 year old female with confusion and memory loss on 10/15/15.  FINDINGS: Background rhythms of 13-14 hertz and 15-20 microvolts. No focal, lateralizing, epileptiform activity or seizures are seen. Patient recorded in the awake and drowsy state. EKG channel shows regular rhythm of 70-75 beats per minute.   IMPRESSION:  Normal EEG in the awake and drowsy states.    INTERPRETING PHYSICIAN:  Penni Bombard, MD Certified in Neurology, Neurophysiology and Neuroimaging  [facility] [address] [place], [place] [address] [phone]

## 2015-12-16 ENCOUNTER — Telehealth: Payer: Self-pay | Admitting: Adult Health

## 2015-12-16 NOTE — Telephone Encounter (Signed)
Called and left VM that EEG was normal.

## 2016-01-22 ENCOUNTER — Ambulatory Visit (INDEPENDENT_AMBULATORY_CARE_PROVIDER_SITE_OTHER): Payer: BLUE CROSS/BLUE SHIELD | Admitting: Internal Medicine

## 2016-01-22 DIAGNOSIS — Z9189 Other specified personal risk factors, not elsewhere classified: Secondary | ICD-10-CM

## 2016-01-22 DIAGNOSIS — Z789 Other specified health status: Secondary | ICD-10-CM

## 2016-01-22 DIAGNOSIS — Z7189 Other specified counseling: Secondary | ICD-10-CM

## 2016-01-22 DIAGNOSIS — Z7184 Encounter for health counseling related to travel: Secondary | ICD-10-CM

## 2016-01-22 MED ORDER — ATOVAQUONE-PROGUANIL HCL 250-100 MG PO TABS: 1.0000 | ORAL_TABLET | Freq: Every day | ORAL | 0 refills | Status: DC

## 2016-01-22 MED ORDER — AZITHROMYCIN 500 MG PO TABS: 1000.0000 mg | ORAL_TABLET | Freq: Once | ORAL | 0 refills | Status: AC

## 2016-01-22 NOTE — Progress Notes (Signed)
Subjective:   Sarah Buchanan is a 65 y.o. female who presents to the Infectious Disease clinic for travel consultation. Planned departure date: February 2018          Planned return date: 3 weeks Countries of travel: [place]  Areas in country: urban   Accommodations: hotel Purpose of travel: vacation Prior travel out of [place]: yes     Objective:   Medications: reviewed    Assessment:   No contraindications to travel. none     Plan:    Issues discussed: altitude illness, environmental concerns, freshwater swimming, insect-borne illnesses, Japanese encephalitis, malaria, motion sickness, MVA safety, rabies, safe food/water, traveler's diarrhea, website/handouts for more information, what to do if ill upon return and what to do if ill while there. Immunizations recommended: none indicated. Malaria prophylaxis: malarone, daily dose starting 1-2 days before entering endemic area, ending 7 days after leaving area Traveler's diarrhea prophylaxis: azithromycin. Total duration of visit: 1 Hour. Total time spent on education, counseling, coordination of care: 30 Minutes.

## 2016-01-22 NOTE — Patient Instructions (Signed)
[facility]                [address], [place]                   [place], [place] [address]                      Phone: [phone]                        Fax: [phone]   Planned departure date: February 2018          Planned return date: 3 weeks Countries of travel: [place]    Guidelines for the Prevention & Treatment of Traveler's Diarrhea  Prevention: "Boil it, Peel it, [place] it, or Forget it"   the fewer chances -> lower risk: try to stick to food & water precautions as much as possible"   If it's "piping hot"; it is probably okay, if not, it may not be   Treatment   1) You should always take care to drink lots of fluids in order to avoid dehydration   2) You should bring medications with you in case you come down with a case of diarrhea   3) OTC = bring pepto-bismol - can take with initial abdominal symptoms;                    Imodium - can help slow down your intestinal tract, can help relief cramps                    and diarrhea, can take if no bloody diarrhea  Use azithromycin if needed for traveler's diarrhea  Guidelines for the Prevention of Malaria  Avoidance:  -fewer mosquito bites = lower risk. Mosquitos can bite at night as well as daytime  -cover up (long sleeve clothing), mosquito nets, screens  -Insect repellent for your skin ( DEET containing lotion > 20%): for clothes ( permethrin spray)   2 days prior to travel, start malarone, daily dose starting 1-2 days before entering endemic area, ending 7 days after leaving area for malaria prevention.   Immunizations received today: none indicated  Future immunizations, if indicated none indicated   Prior to travel:  1) Be sure to pick up appropriate prescriptions, including medicine you take daily. Do not expect to be able to fill your prescriptions abroad.  2) Strongly consider obtaining traveler's insurance, including emergency evacuation insurance. Most  plans in the [place] do not cover participants abroad. (see below for resources)  3) Register at the appropriate [place] embassy or consulate with travel dates so they are aware of your presence in-country and for helpful advice during travel using the [employer] (STEP, GreenNylon.com.cy).  4) Leave contact information with a relative or friend.  5) Keep a Research officer, political party, credit cards in case they become lost or stolen  6) Inform your credit card company that you will be travelling abroad   During travel:  1) If you become ill and need medical advice, the U.S[place] [employer]	[place] of the country you are traveling in general provides a list of [place] speaking doctors.  We are also available on MyChart for remote consultation if you register prior to travel. 2) Avoid motorcycles or scooters when at all possible. Traffic laws in many countries are lax and accidents occur frequently.  3) Do not  take any unnecessary risks that you wouldn't do at home.   Resources:  -Country specific information: BlindResource.ca or GreenNylon.com.cy  -Press photographer (DEET, mosquito nets): REI, Dick's Sporting Goods store, [employer], [employer] insurance options: gatewayplans.com; [email or web address]-rivera.info/; travelguard.com or Good Pilgrim's Pride, gninsurance.com or info@gninsurance .com, [id number].   Post Travel:  If you return from your trip ill, call your primary care doctor or our travel clinic @ [phone].   Enjoy your trip and know that with proper pre-travel preparation, most people have an enjoyable and uninterrupted trip!

## 2016-03-06 ENCOUNTER — Ambulatory Visit (HOSPITAL_COMMUNITY)
Admission: EM | Admit: 2016-03-06 | Discharge: 2016-03-06 | Disposition: A | Discharge status: Home / Self Care | Payer: BLUE CROSS/BLUE SHIELD | Attending: Emergency Medicine | Admitting: Emergency Medicine

## 2016-03-06 ENCOUNTER — Encounter (HOSPITAL_COMMUNITY): Payer: Self-pay

## 2016-03-06 DIAGNOSIS — T148XXA Other injury of unspecified body region, initial encounter: Secondary | ICD-10-CM | POA: Diagnosis not present

## 2016-03-06 MED ORDER — CYCLOBENZAPRINE HCL 5 MG PO TABS: 5.0000 mg | ORAL_TABLET | Freq: Three times a day (TID) | ORAL | 0 refills | Status: DC | PRN

## 2016-03-06 MED ORDER — TRAMADOL HCL 50 MG PO TABS: 50.0000 mg | ORAL_TABLET | Freq: Four times a day (QID) | ORAL | 0 refills | Status: DC | PRN

## 2016-03-06 MED ORDER — PREDNISONE 20 MG PO TABS: 20.0000 mg | ORAL_TABLET | Freq: Every day | ORAL | 0 refills | Status: DC

## 2016-03-06 NOTE — Discharge Instructions (Signed)
This is all consistent with a muscle strain. Take prednisone daily for 5 days. Take this with food. Use the Flexeril 3 times a day as needed for muscle spasms. This medicine will make you drowsy. Take tramadol every 6-8 hours as needed for pain. If this is not improving in the next week or so, please follow-up with your primary care doctor for additional evaluation.

## 2016-03-06 NOTE — ED Provider Notes (Signed)
[facility]    CSN: [id number] Arrival date & time: 03/06/16  1210     History   Chief Complaint Chief Complaint  Patient presents with  . Back Pain    HPI Sarah Buchanan is a 65 y.o. female.   HPI She is a 65 year old woman here for evaluation of back pain. She states she pulled a muscle doing yoga half ago. She has reinjured it several times since then. Most recently, she strained it lifting a 40 pound bag of dog food. Pain is located in the left mid back. She will occasionally get spasms. She has tried taking ibuprofen, but this caused upset stomach and vomiting. She has taken tramadol with some improvement. She is also to ice and heat with some improvement. No radiating pain. No injuries or trauma.  Past Medical History:  Diagnosis Date  . Varicose veins     Patient Active Problem List   Diagnosis Date Noted  . Varicose veins of right lower extremity with complications [id number]  . Patellofemoral arthritis of right knee 11/06/2013  . Right medial knee pain 09/26/2013  . BUNIONETTE 05/09/2007  . UNEQUAL LEG LENGTH 05/09/2007  . HYPOTHYROIDISM 05/02/2007  . METATARSALGIA 05/02/2007  . PLANTAR FASCIITIS, BILATERAL 05/02/2007    History reviewed. No pertinent surgical history.  OB History    No data available       Home Medications    Prior to Admission medications   Medication Sig Start Date End Date Taking? Authorizing Provider  aspirin 81 MG tablet Take 81 mg by mouth daily.   Yes Historical Provider, MD  progesterone (PROMETRIUM) 100 MG capsule  09/03/13  Yes Historical Provider, MD  SYNTHROID 88 MCG tablet Take 88 mcg by mouth daily before breakfast. 12/21/15  Yes Historical Provider, MD  atovaquone-proguanil (MALARONE) 250-100 MG TABS tablet Take 1 tablet by mouth daily. Start 2 days prior to travel to malaria area, throughout travel and for 7 days upon return. 01/22/16   Thayer Headings, MD  Calcium Citrate-Vitamin D (CALCIUM CITRATE + D PO)  Take by mouth. Takes 600-500  daily    Historical Provider, MD  cholecalciferol (VITAMIN D) 1000 UNITS tablet Take 2,000 Units by mouth daily.     Historical Provider, MD  Cholecalciferol (VITAMIN D-3 PO) Take 1,200 mg by mouth daily.    Historical Provider, MD  co-enzyme Q-10 50 MG capsule Take 300 mg by mouth 2 (two) times daily.     Historical Provider, MD  cyclobenzaprine (FLEXERIL) 5 MG tablet Take 1 tablet (5 mg total) by mouth 3 (three) times daily as needed for muscle spasms. 03/06/16   Melony Overly, MD  estradiol (VIVELLE-DOT) 0.05 MG/24HR patch  09/24/13   Historical Provider, MD  Flaxseed Oil OIL 1,400 mg by Does not apply route daily.     Historical Provider, MD  Magnesium 300 MG CAPS Take 400 mg by mouth daily.     Historical Provider, MD  Multiple Vitamin (MULTIVITAMIN) capsule Take 1 capsule by mouth daily.    Historical Provider, MD  Omega-3 Fatty Acids (FISH OIL) 1000 MG CAPS Take 2,000 mg by mouth daily.    Historical Provider, MD  predniSONE (DELTASONE) 20 MG tablet Take 1 tablet (20 mg total) by mouth daily. 03/06/16   Melony Overly, MD  traMADol (ULTRAM) 50 MG tablet Take 1 tablet (50 mg total) by mouth every 6 (six) hours as needed. 03/06/16   Melony Overly, MD  XIIDRA 5 % SOLN INT ONE  GTT IN OU BID 12/15/15   Historical Provider, MD    Family History Family History  Problem Relation Age of Onset  . Dementia Mother   . Stroke Father   . Lymphoma Brother     Social History Social History  Substance Use Topics  . Smoking status: Never Smoker  . Smokeless tobacco: Never Used  . Alcohol use 0.6 oz/week    1 Glasses of wine per week     Allergies   Patient has no known allergies.   Review of Systems Review of Systems As in history of present illness  Physical Exam Triage Vital Signs ED Triage Vitals  Enc Vitals Group     BP 03/06/16 1320 153/78     Pulse Rate 03/06/16 1320 (!) 59     Resp 03/06/16 1320 16     Temp 03/06/16 1320 98.1 F (36.7 C)     Temp  Source 03/06/16 1320 Oral     SpO2 03/06/16 1320 96 %     Weight --      Height --      Head Circumference --      Peak Flow --      Pain Score 03/06/16 1321 6     Pain Loc --      Pain Edu? --      Excl. in [place]? --    No data found.   Updated Vital Signs BP 153/78 (BP Location: Left Arm)   Pulse (!) 59   Temp 98.1 F (36.7 C) (Oral)   Resp 16   SpO2 96%   Visual Acuity Right Eye Distance:   Left Eye Distance:   Bilateral Distance:    Right Eye Near:   Left Eye Near:    Bilateral Near:     Physical Exam  Constitutional: She is oriented to person, place, and time. She appears well-developed and well-nourished. No distress.  Cardiovascular: Normal rate.   Pulmonary/Chest: Effort normal.  Musculoskeletal:  Back: No erythema or edema. No vertebral tenderness or step-offs. She has minimal spasm in the left thoracic paraspinous muscles at the bra line. Palpation of this area causes some discomfort.  Neurological: She is alert and oriented to person, place, and time.     UC Treatments / Results  Labs (all labs ordered are listed, but only abnormal results are displayed) Labs Reviewed - No data to display  EKG  EKG Interpretation None       Radiology No results found.  Procedures Procedures (including critical care time)  Medications Ordered in UC Medications - No data to display   Initial Impression / Assessment and Plan / UC Course  I have reviewed the triage vital signs and the nursing notes.  Pertinent labs & imaging results that were available during my care of the patient were reviewed by me and considered in my medical decision making (see chart for details).  Clinical Course     We'll treat with low-dose prednisone for 5 days as she cannot tolerate NSAIDs. Tramadol as needed for pain. Flexeril as needed for muscle spasms. If not improving over the next 1-2 weeks, follow-up with PCP for additional evaluation. Also recommended taking daily Prilosec  for a day or 2 for the ibuprofen-related gastritis.  Final Clinical Impressions(s) / UC Diagnoses   Final diagnoses:  Muscle strain    New Prescriptions Discharge Medication List as of 03/06/2016  1:37 PM    START taking these medications   Details  cyclobenzaprine (FLEXERIL) 5 MG  tablet Take 1 tablet (5 mg total) by mouth 3 (three) times daily as needed for muscle spasms., Starting Sun 03/06/2016, Normal    predniSONE (DELTASONE) 20 MG tablet Take 1 tablet (20 mg total) by mouth daily., Starting Sun 03/06/2016, Normal    traMADol (ULTRAM) 50 MG tablet Take 1 tablet (50 mg total) by mouth every 6 (six) hours as needed., Starting Sun 03/06/2016, Normal         Melony Overly, MD 03/06/16 1401

## 2016-03-06 NOTE — ED Triage Notes (Signed)
Pt said she has a hx of back pain and thought it was getting better. On Thursday lifted a 40lb bag of dog food. 3 nights no sleep. Took advil 800mg  tid and was vomiting Friday. Took tramadol 50mg  bid on sat. And tylenol intermittent. Still feeling nauseous and abdominal pain. Having a burning sensation and muscle spasm on the left lower side. Also using ice and hot baths.

## 2016-03-14 DIAGNOSIS — H33321 Round hole, right eye: Secondary | ICD-10-CM | POA: Diagnosis not present

## 2016-03-14 DIAGNOSIS — H33302 Unspecified retinal break, left eye: Secondary | ICD-10-CM | POA: Diagnosis not present

## 2016-03-14 DIAGNOSIS — H43812 Vitreous degeneration, left eye: Secondary | ICD-10-CM | POA: Diagnosis not present

## 2016-03-14 DIAGNOSIS — H02402 Unspecified ptosis of left eyelid: Secondary | ICD-10-CM | POA: Diagnosis not present

## 2016-04-11 DIAGNOSIS — H04123 Dry eye syndrome of bilateral lacrimal glands: Secondary | ICD-10-CM | POA: Diagnosis not present

## 2016-04-11 DIAGNOSIS — L719 Rosacea, unspecified: Secondary | ICD-10-CM | POA: Diagnosis not present

## 2016-04-27 ENCOUNTER — Other Ambulatory Visit: Payer: Self-pay | Admitting: Internal Medicine

## 2016-04-27 DIAGNOSIS — Z1231 Encounter for screening mammogram for malignant neoplasm of breast: Secondary | ICD-10-CM

## 2016-05-04 ENCOUNTER — Ambulatory Visit: Payer: BLUE CROSS/BLUE SHIELD | Admitting: Neurology

## 2016-05-30 DIAGNOSIS — Z6830 Body mass index (BMI) 30.0-30.9, adult: Secondary | ICD-10-CM | POA: Diagnosis not present

## 2016-05-30 DIAGNOSIS — E039 Hypothyroidism, unspecified: Secondary | ICD-10-CM | POA: Diagnosis not present

## 2016-05-30 DIAGNOSIS — E785 Hyperlipidemia, unspecified: Secondary | ICD-10-CM | POA: Diagnosis not present

## 2016-05-30 DIAGNOSIS — H938X2 Other specified disorders of left ear: Secondary | ICD-10-CM | POA: Diagnosis not present

## 2016-05-30 DIAGNOSIS — H9042 Sensorineural hearing loss, unilateral, left ear, with unrestricted hearing on the contralateral side: Secondary | ICD-10-CM | POA: Diagnosis not present

## 2016-05-30 DIAGNOSIS — H7292 Unspecified perforation of tympanic membrane, left ear: Secondary | ICD-10-CM | POA: Diagnosis not present

## 2016-05-31 ENCOUNTER — Ambulatory Visit (INDEPENDENT_AMBULATORY_CARE_PROVIDER_SITE_OTHER): Payer: Medicare Other | Admitting: Neurology

## 2016-05-31 ENCOUNTER — Encounter: Payer: Self-pay | Admitting: Neurology

## 2016-05-31 VITALS — BP 123/75 | HR 73 | Ht 65.0 in | Wt 192.4 lb

## 2016-05-31 DIAGNOSIS — G454 Transient global amnesia: Secondary | ICD-10-CM

## 2016-05-31 NOTE — Progress Notes (Signed)
[facility] [address] [place]. [place] [address] [phone]       OFFICE FOLLOW UP VISIT NOTE  Ms. 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 Patient is able to recall only bits and pieces. She states she went for her regularly oh and exercise and as she walked to the parking lot she while thinking through herself cannot remember the day or the season. She had a large appointment with her friend and she must have called them as well as texted her son and daughter. Somehow she was able to drive and make it to a friend's house. There she was found to be confused and kept on repeating herself and asking the same question over and over again. Her friend brought her to the emergency room with a code stroke was called. CT scan of the head on admission was unremarkable. She was seen by the  neurologist on call Dr. Tasia Catchings where she had transient global amnesia. MRI scan of the brain was obtained with thin sections thru medial temporal lobes which  I have personally reviewed and showed punctate diffusion hyperintensity in the hippocampus which has been described in patients with transient global amnesia. Patient was discharged home and states she's had no problems with making new memories and recall the event is still patchy and foggy. She has no known history of stroke, TIA, seizures, migraine. Interestingly she does have a family history of transient global amnesia in her dad and also possibly a brother. She does have family history of Alzheimer's and her mother is going to die soon and patient is dealing with this and is single Orthoptist. She denies any headache, gait or balance  problems. She was previously on aspirin and was asked to change to Plavix by her primary physician but has not yet filled the prescription.  Update 05/31/2016 : She returns for follow-up after initial visit 6 months ago. She continues to do well without recurrent stroke or TIA memory loss symptoms. She feels her memory is doing great. She interestingly informed me that she has a family history of transient global amnesia in her younger brother in his 25s as well as with her dad. She just returned from a 3 week trip to [place] and was quite happy with the visit. She remains on aspirin and she is tolerating it well without bruising or bleeding. She had an EEG done on 11/30/15 which was read by me and was normal. She has no new complaints today. Her mother died in 12-07-2022 last year and she stated that she is over the grieving.and has moved on. She suffered a back muscle strain in November 2017 but has recovered well from it ROS:   14 system review of systems is positive for  right knee pain, muscle strain only and all other systems negative and all other systems negative PMH:  Past Medical History:  Diagnosis Date  . Hypothyroidism   . Varicose veins     Social History:  Social History   Social History  . Marital status: Divorced    Spouse name: N/A  . Number of children: N/A  . Years of education: N/A   Occupational History  . Not on file.   Social  History Main Topics  . Smoking status: Never Smoker  . Smokeless tobacco: Never Used  . Alcohol use 0.6 oz/week    1 Glasses of wine per week     Comment: last drink 11/2015  . Drug use: No  . Sexual activity: Not on file   Other Topics Concern  . Not on file   Social History Narrative  . No narrative on file    Medications:   Current Outpatient Prescriptions on File Prior to Visit  Medication Sig Dispense Refill  . aspirin 81 MG tablet Take 81 mg by mouth daily.    . Calcium Citrate-Vitamin D (CALCIUM CITRATE + D PO) Take by mouth.  Takes 600-500  daily    . co-enzyme Q-10 50 MG capsule Take 300 mg by mouth 2 (two) times daily.     Marland Kitchen estradiol (VIVELLE-DOT) 0.05 MG/24HR patch     . Flaxseed Oil OIL 1,400 mg by Does not apply route daily.     . Magnesium 300 MG CAPS Take 400 mg by mouth daily.     . Multiple Vitamin (MULTIVITAMIN) capsule Take 1 capsule by mouth daily.    . Omega-3 Fatty Acids (FISH OIL) 1000 MG CAPS Take 2,000 mg by mouth daily.    . progesterone (PROMETRIUM) 100 MG capsule     . SYNTHROID 88 MCG tablet Take 100 mcg by mouth daily before breakfast.   1  . XIIDRA 5 % SOLN INT ONE GTT IN OU BID  0   No current facility-administered medications on file prior to visit.     Allergies:  No Known Allergies  Physical Exam General: well developed, well nourished Middle-age Caucasian lady, seated, in no evident distress Head: head normocephalic and atraumatic.   Neck: supple with no carotid or supraclavicular bruits Cardiovascular: regular rate and rhythm, no murmurs Musculoskeletal: no deformity Skin:  no rash/petichiae Vascular:  Normal pulses all extremities  Neurologic Exam Mental Status: Awake and fully alert. Oriented to place and time. Recent and remote memory intact. Attention span, concentration and fund of knowledge appropriate. Mood and affect appropriate. Diminished recall 2/3. Animal naming 15. Cranial Nerves: Fundoscopic exam reveals sharp disc margins. Pupils equal, briskly reactive to light. Extraocular movements full without nystagmus. Visual fields full to confrontation. Hearing intact. Facial sensation intact. Face, tongue, palate moves normally and symmetrically.  Motor: Normal bulk and tone. Normal strength in all tested extremity muscles. Sensory.: intact to touch , pinprick , position and vibratory sensation.  Coordination: Rapid alternating movements normal in all extremities. Finger-to-nose and heel-to-shin performed accurately bilaterally. Gait and Station: Arises from chair  without difficulty. Stance is normal. Gait demonstrates normal stride length and balance . Able to heel, toe and tandem walk without difficulty.  Reflexes: 1+ and symmetric. Toes downgoing.      ASSESSMENT: 67 year old Caucasian lady with transient episode of short-term memory loss and confusion quite suggestive of transient global amnesia. Complex partial seizure, posterior separation TIA complicated migraine would be less likely.    PLAN: I had a long discussion with Dr. Hardin Negus regards to her solitary episode of transient global amnesia in August 2017. She seems to be doing well without any recurrent or residual symptoms. Continue aspirin for stroke prevention. I do not believe further neurological testing or even follow-up is necessary at the present time. She was advised to follow-up with her primary physician Dr. Joylene Draft and she may be referred back in the future as needed.. Greater than 50% time during this 25 minute visit  was spent on counseling and coordination of care about transient global amnesia.   Antony Contras, MD  [facility] [address] [place], [place] [address]  Phone [phone] Fax [phone] Note: This document was prepared with digital dictation and possible smart phrase technology. Any transcriptional errors that result from this process are unintentional.

## 2016-05-31 NOTE — Patient Instructions (Signed)
I had a long discussion with Dr. Hardin Negus regards to her solitary episode of transient global amnesia in August 2017. She seems to be doing well without any recurrent or residual symptoms. Continue aspirin for stroke prevention. I do not believe further neurological testing or even follow-up is necessary at the present time. She was advised to follow-up with her primary physician Dr. Joylene Draft and she may be referred back in the future as needed.

## 2016-06-02 DIAGNOSIS — E785 Hyperlipidemia, unspecified: Secondary | ICD-10-CM | POA: Diagnosis not present

## 2016-06-02 DIAGNOSIS — M8580 Other specified disorders of bone density and structure, unspecified site: Secondary | ICD-10-CM | POA: Diagnosis not present

## 2016-06-02 DIAGNOSIS — E039 Hypothyroidism, unspecified: Secondary | ICD-10-CM | POA: Diagnosis not present

## 2016-06-06 DIAGNOSIS — M810 Age-related osteoporosis without current pathological fracture: Secondary | ICD-10-CM | POA: Diagnosis not present

## 2016-06-06 DIAGNOSIS — E784 Other hyperlipidemia: Secondary | ICD-10-CM | POA: Diagnosis not present

## 2016-06-07 DIAGNOSIS — H35371 Puckering of macula, right eye: Secondary | ICD-10-CM | POA: Diagnosis not present

## 2016-06-07 DIAGNOSIS — H04123 Dry eye syndrome of bilateral lacrimal glands: Secondary | ICD-10-CM | POA: Diagnosis not present

## 2016-06-07 DIAGNOSIS — H25813 Combined forms of age-related cataract, bilateral: Secondary | ICD-10-CM | POA: Diagnosis not present

## 2016-06-07 DIAGNOSIS — H524 Presbyopia: Secondary | ICD-10-CM | POA: Diagnosis not present

## 2016-06-07 DIAGNOSIS — H35343 Macular cyst, hole, or pseudohole, bilateral: Secondary | ICD-10-CM | POA: Diagnosis not present

## 2016-06-08 ENCOUNTER — Ambulatory Visit
Admission: RE | Admit: 2016-06-08 | Discharge: 2016-06-08 | Disposition: A | Discharge status: Home / Self Care | Payer: Medicare Other | Source: Ambulatory Visit | Attending: Internal Medicine | Admitting: Internal Medicine

## 2016-06-08 DIAGNOSIS — Z1231 Encounter for screening mammogram for malignant neoplasm of breast: Secondary | ICD-10-CM

## 2016-06-16 DIAGNOSIS — Z1212 Encounter for screening for malignant neoplasm of rectum: Secondary | ICD-10-CM | POA: Diagnosis not present

## 2016-06-29 DIAGNOSIS — Z6831 Body mass index (BMI) 31.0-31.9, adult: Secondary | ICD-10-CM | POA: Diagnosis not present

## 2016-06-29 DIAGNOSIS — Z0000 Encounter for general adult medical examination without abnormal findings: Secondary | ICD-10-CM | POA: Diagnosis not present

## 2016-06-29 DIAGNOSIS — G454 Transient global amnesia: Secondary | ICD-10-CM | POA: Diagnosis not present

## 2016-06-29 DIAGNOSIS — H7290 Unspecified perforation of tympanic membrane, unspecified ear: Secondary | ICD-10-CM | POA: Diagnosis not present

## 2016-06-29 DIAGNOSIS — E038 Other specified hypothyroidism: Secondary | ICD-10-CM | POA: Diagnosis not present

## 2016-06-29 DIAGNOSIS — M810 Age-related osteoporosis without current pathological fracture: Secondary | ICD-10-CM | POA: Diagnosis not present

## 2016-06-29 DIAGNOSIS — Z780 Asymptomatic menopausal state: Secondary | ICD-10-CM | POA: Diagnosis not present

## 2016-06-29 DIAGNOSIS — E784 Other hyperlipidemia: Secondary | ICD-10-CM | POA: Diagnosis not present

## 2016-06-29 DIAGNOSIS — R0789 Other chest pain: Secondary | ICD-10-CM | POA: Diagnosis not present

## 2016-06-29 DIAGNOSIS — H04129 Dry eye syndrome of unspecified lacrimal gland: Secondary | ICD-10-CM | POA: Diagnosis not present

## 2016-06-29 DIAGNOSIS — M722 Plantar fascial fibromatosis: Secondary | ICD-10-CM | POA: Diagnosis not present

## 2016-06-29 DIAGNOSIS — Z23 Encounter for immunization: Secondary | ICD-10-CM | POA: Diagnosis not present

## 2016-07-20 DIAGNOSIS — Z85828 Personal history of other malignant neoplasm of skin: Secondary | ICD-10-CM | POA: Diagnosis not present

## 2016-07-20 DIAGNOSIS — D1801 Hemangioma of skin and subcutaneous tissue: Secondary | ICD-10-CM | POA: Diagnosis not present

## 2016-07-20 DIAGNOSIS — D225 Melanocytic nevi of trunk: Secondary | ICD-10-CM | POA: Diagnosis not present

## 2016-07-20 DIAGNOSIS — L814 Other melanin hyperpigmentation: Secondary | ICD-10-CM | POA: Diagnosis not present

## 2016-07-20 DIAGNOSIS — L821 Other seborrheic keratosis: Secondary | ICD-10-CM | POA: Diagnosis not present

## 2016-09-01 DIAGNOSIS — F329 Major depressive disorder, single episode, unspecified: Secondary | ICD-10-CM | POA: Diagnosis not present

## 2016-09-09 DIAGNOSIS — F329 Major depressive disorder, single episode, unspecified: Secondary | ICD-10-CM | POA: Diagnosis not present

## 2016-09-16 DIAGNOSIS — F329 Major depressive disorder, single episode, unspecified: Secondary | ICD-10-CM | POA: Diagnosis not present

## 2016-09-19 DIAGNOSIS — R040 Epistaxis: Secondary | ICD-10-CM | POA: Diagnosis not present

## 2016-09-19 DIAGNOSIS — Z5321 Procedure and treatment not carried out due to patient leaving prior to being seen by health care provider: Secondary | ICD-10-CM | POA: Diagnosis not present

## 2016-09-22 DIAGNOSIS — R040 Epistaxis: Secondary | ICD-10-CM | POA: Diagnosis not present

## 2016-09-22 DIAGNOSIS — J342 Deviated nasal septum: Secondary | ICD-10-CM | POA: Diagnosis not present

## 2016-09-28 DIAGNOSIS — F329 Major depressive disorder, single episode, unspecified: Secondary | ICD-10-CM | POA: Diagnosis not present

## 2016-10-05 DIAGNOSIS — F329 Major depressive disorder, single episode, unspecified: Secondary | ICD-10-CM | POA: Diagnosis not present

## 2016-10-10 DIAGNOSIS — Z6832 Body mass index (BMI) 32.0-32.9, adult: Secondary | ICD-10-CM | POA: Diagnosis not present

## 2016-10-10 DIAGNOSIS — Z01419 Encounter for gynecological examination (general) (routine) without abnormal findings: Secondary | ICD-10-CM | POA: Diagnosis not present

## 2016-10-13 DIAGNOSIS — F329 Major depressive disorder, single episode, unspecified: Secondary | ICD-10-CM | POA: Diagnosis not present

## 2016-10-18 DIAGNOSIS — H35343 Macular cyst, hole, or pseudohole, bilateral: Secondary | ICD-10-CM | POA: Diagnosis not present

## 2016-10-18 DIAGNOSIS — H35373 Puckering of macula, bilateral: Secondary | ICD-10-CM | POA: Diagnosis not present

## 2016-10-18 DIAGNOSIS — H25813 Combined forms of age-related cataract, bilateral: Secondary | ICD-10-CM | POA: Diagnosis not present

## 2016-10-18 DIAGNOSIS — L719 Rosacea, unspecified: Secondary | ICD-10-CM | POA: Diagnosis not present

## 2016-10-18 DIAGNOSIS — H04123 Dry eye syndrome of bilateral lacrimal glands: Secondary | ICD-10-CM | POA: Diagnosis not present

## 2016-10-20 DIAGNOSIS — F329 Major depressive disorder, single episode, unspecified: Secondary | ICD-10-CM | POA: Diagnosis not present

## 2016-11-04 DIAGNOSIS — Z23 Encounter for immunization: Secondary | ICD-10-CM | POA: Diagnosis not present

## 2016-11-04 DIAGNOSIS — F329 Major depressive disorder, single episode, unspecified: Secondary | ICD-10-CM | POA: Diagnosis not present

## 2016-11-08 DIAGNOSIS — F329 Major depressive disorder, single episode, unspecified: Secondary | ICD-10-CM | POA: Diagnosis not present

## 2016-11-11 ENCOUNTER — Ambulatory Visit (INDEPENDENT_AMBULATORY_CARE_PROVIDER_SITE_OTHER): Payer: Self-pay | Admitting: Internal Medicine

## 2016-11-11 DIAGNOSIS — Z7184 Encounter for health counseling related to travel: Secondary | ICD-10-CM

## 2016-11-11 DIAGNOSIS — Z7189 Other specified counseling: Secondary | ICD-10-CM

## 2016-11-11 NOTE — Patient Instructions (Signed)
[facility]                [address], [place]                   [place], [place] [address]                      Phone: [phone]                        Fax: [phone]   Planned departure date: December 2018          Planned return date: 4 weeks Countries of travel: [place], [place] and [place]   Guidelines for the Prevention & Treatment of Traveler's Diarrhea  Prevention: "Boil it, Peel it, [place] it, or Forget it"   the fewer chances -> lower risk: try to stick to food & water precautions as much as possible"   If it's "piping hot"; it is probably okay, if not, it may not be   Treatment   1) You should always take care to drink lots of fluids in order to avoid dehydration   2) You should bring medications with you in case you come down with a case of diarrhea   3) OTC = bring pepto-bismol - can take with initial abdominal symptoms;                    Imodium - can help slow down your intestinal tract, can help relief cramps                    and diarrhea, can take if no bloody diarrhea  Use azithromycin if needed for traveler's diarrhea  Guidelines for the Prevention of Malaria  Avoidance:  -fewer mosquito bites = lower risk. Mosquitos can bite at night as well as daytime  -cover up (long sleeve clothing), mosquito nets, screens  -Insect repellent for your skin ( DEET containing lotion > 20%): for clothes ( permethrin spray)    Immunizations received today: JEV  Future immunizations, if indicated JEV in 28 days   Prior to travel:  1) Be sure to pick up appropriate prescriptions, including medicine you take daily. Do not expect to be able to fill your prescriptions abroad.  2) Strongly consider obtaining traveler's insurance, including emergency evacuation insurance. Most plans in the [place] do not cover participants abroad. (see below for resources)  3) Register at the appropriate [place] embassy or consulate with  travel dates so they are aware of your presence in-country and for helpful advice during travel using the [employer] (STEP, GreenNylon.com.cy).  4) Leave contact information with a relative or friend.  5) Keep a Research officer, political party, credit cards in case they become lost or stolen  6) Inform your credit card company that you will be travelling abroad   During travel:  1) If you become ill and need medical advice, the [place] [employer]	[place] of the country you are traveling in general provides a list of [place] speaking doctors.  We are also available on MyChart for remote consultation if you register prior to travel. 2) Avoid motorcycles or scooters when at all possible. Traffic laws in many countries are lax and accidents occur frequently.  3) Do not take any unnecessary risks that you wouldn't do at home.   Resources:  -Country specific information: BlindResource.ca or GreenNylon.com.cy  -Press photographer (  DEET, mosquito nets): [employer], [employer]ing Goods store, [employer], [place] insurance options: Emhouse.com; [email or web address]-rivera.info/; travelguard.com or Good Pilgrim's Pride, gninsurance.com or info@gninsurance .com, [id number].   Post Travel:  If you return from your trip ill, call your primary care doctor or our travel clinic @ [phone].   Enjoy your trip and know that with proper pre-travel preparation, most people have an enjoyable and uninterrupted trip!

## 2016-11-14 DIAGNOSIS — Z7184 Encounter for health counseling related to travel: Secondary | ICD-10-CM | POA: Insufficient documentation

## 2016-11-14 NOTE — Progress Notes (Signed)
Subjective:   Sarah Buchanan is a 66 y.o. female who presents to the Infectious Disease clinic for travel consultation. Planned departure date: December 2018          Planned return date: 4 weeks Countries of travel: [place], [place] and [place] Areas in country: urban   Accommodations: hotel and private home Purpose of travel: vacation Prior travel out of [place]: yes     Objective:   Medications: reviewed   Assessment:   No contraindications to travel. none     Plan:    Issues discussed: altitude illness, environmental concerns, freshwater swimming, future shots, Japanese encephalitis, malaria, motion sickness, rabies, safe food/water, traveler's diarrhea, website/handouts for more information, what to do if ill upon return and what to do if ill while there. Immunizations recommended: none indicated. Malaria prophylaxis: not indicated Traveler's diarrhea prophylaxis: azithromycin.she already has a supply Total duration of visit: 1 Hour. Total time spent on education, counseling, coordination of care: 30 Minutes.

## 2016-11-17 DIAGNOSIS — F329 Major depressive disorder, single episode, unspecified: Secondary | ICD-10-CM | POA: Diagnosis not present

## 2016-11-29 DIAGNOSIS — M8580 Other specified disorders of bone density and structure, unspecified site: Secondary | ICD-10-CM | POA: Diagnosis not present

## 2016-11-29 DIAGNOSIS — E785 Hyperlipidemia, unspecified: Secondary | ICD-10-CM | POA: Diagnosis not present

## 2016-11-29 DIAGNOSIS — E039 Hypothyroidism, unspecified: Secondary | ICD-10-CM | POA: Diagnosis not present

## 2016-12-06 DIAGNOSIS — E039 Hypothyroidism, unspecified: Secondary | ICD-10-CM | POA: Diagnosis not present

## 2016-12-06 DIAGNOSIS — M8580 Other specified disorders of bone density and structure, unspecified site: Secondary | ICD-10-CM | POA: Diagnosis not present

## 2016-12-06 DIAGNOSIS — E785 Hyperlipidemia, unspecified: Secondary | ICD-10-CM | POA: Diagnosis not present

## 2016-12-13 ENCOUNTER — Other Ambulatory Visit: Payer: Self-pay | Admitting: Physician Assistant

## 2016-12-13 DIAGNOSIS — M8580 Other specified disorders of bone density and structure, unspecified site: Secondary | ICD-10-CM

## 2017-01-11 ENCOUNTER — Ambulatory Visit (INDEPENDENT_AMBULATORY_CARE_PROVIDER_SITE_OTHER): Payer: Medicare Other | Admitting: Psychology

## 2017-01-11 DIAGNOSIS — F411 Generalized anxiety disorder: Secondary | ICD-10-CM

## 2017-03-17 ENCOUNTER — Ambulatory Visit (INDEPENDENT_AMBULATORY_CARE_PROVIDER_SITE_OTHER): Payer: Medicare Other | Admitting: Psychology

## 2017-03-17 DIAGNOSIS — F411 Generalized anxiety disorder: Secondary | ICD-10-CM | POA: Diagnosis not present

## 2017-03-20 ENCOUNTER — Ambulatory Visit
Admission: RE | Admit: 2017-03-20 | Discharge: 2017-03-20 | Disposition: A | Discharge status: Home / Self Care | Payer: Medicare Other | Source: Ambulatory Visit | Attending: Physician Assistant | Admitting: Physician Assistant

## 2017-03-20 DIAGNOSIS — M8580 Other specified disorders of bone density and structure, unspecified site: Secondary | ICD-10-CM

## 2017-04-14 ENCOUNTER — Ambulatory Visit: Payer: Medicare Other | Admitting: Psychology

## 2017-04-17 DIAGNOSIS — H7292 Unspecified perforation of tympanic membrane, left ear: Secondary | ICD-10-CM | POA: Diagnosis not present

## 2017-04-17 DIAGNOSIS — Z6830 Body mass index (BMI) 30.0-30.9, adult: Secondary | ICD-10-CM | POA: Diagnosis not present

## 2017-04-25 ENCOUNTER — Other Ambulatory Visit: Payer: Self-pay | Admitting: Internal Medicine

## 2017-04-25 DIAGNOSIS — Z1231 Encounter for screening mammogram for malignant neoplasm of breast: Secondary | ICD-10-CM

## 2017-06-01 DIAGNOSIS — M8580 Other specified disorders of bone density and structure, unspecified site: Secondary | ICD-10-CM | POA: Diagnosis not present

## 2017-06-01 DIAGNOSIS — E039 Hypothyroidism, unspecified: Secondary | ICD-10-CM | POA: Diagnosis not present

## 2017-06-01 DIAGNOSIS — E785 Hyperlipidemia, unspecified: Secondary | ICD-10-CM | POA: Diagnosis not present

## 2017-06-05 ENCOUNTER — Ambulatory Visit (HOSPITAL_COMMUNITY): Payer: Self-pay | Admitting: Psychiatry

## 2017-06-06 DIAGNOSIS — M8580 Other specified disorders of bone density and structure, unspecified site: Secondary | ICD-10-CM | POA: Diagnosis not present

## 2017-06-06 DIAGNOSIS — E785 Hyperlipidemia, unspecified: Secondary | ICD-10-CM | POA: Diagnosis not present

## 2017-06-06 DIAGNOSIS — E039 Hypothyroidism, unspecified: Secondary | ICD-10-CM | POA: Diagnosis not present

## 2017-06-06 DIAGNOSIS — Z79890 Hormone replacement therapy (postmenopausal): Secondary | ICD-10-CM | POA: Diagnosis not present

## 2017-06-16 ENCOUNTER — Ambulatory Visit
Admission: RE | Admit: 2017-06-16 | Discharge: 2017-06-16 | Disposition: A | Discharge status: Home / Self Care | Payer: Medicare Other | Source: Ambulatory Visit | Attending: Internal Medicine | Admitting: Internal Medicine

## 2017-06-16 DIAGNOSIS — Z1231 Encounter for screening mammogram for malignant neoplasm of breast: Secondary | ICD-10-CM

## 2017-10-12 DIAGNOSIS — Z6831 Body mass index (BMI) 31.0-31.9, adult: Secondary | ICD-10-CM | POA: Diagnosis not present

## 2017-10-12 DIAGNOSIS — Z01419 Encounter for gynecological examination (general) (routine) without abnormal findings: Secondary | ICD-10-CM | POA: Diagnosis not present

## 2017-10-12 DIAGNOSIS — Z780 Asymptomatic menopausal state: Secondary | ICD-10-CM | POA: Diagnosis not present

## 2017-10-16 DIAGNOSIS — M79671 Pain in right foot: Secondary | ICD-10-CM | POA: Diagnosis not present

## 2017-10-16 DIAGNOSIS — S92354A Nondisplaced fracture of fifth metatarsal bone, right foot, initial encounter for closed fracture: Secondary | ICD-10-CM | POA: Diagnosis not present

## 2017-10-16 DIAGNOSIS — S92309A Fracture of unspecified metatarsal bone(s), unspecified foot, initial encounter for closed fracture: Secondary | ICD-10-CM | POA: Diagnosis not present

## 2017-10-24 DIAGNOSIS — H35343 Macular cyst, hole, or pseudohole, bilateral: Secondary | ICD-10-CM | POA: Diagnosis not present

## 2017-10-24 DIAGNOSIS — H25813 Combined forms of age-related cataract, bilateral: Secondary | ICD-10-CM | POA: Diagnosis not present

## 2017-10-24 DIAGNOSIS — H04123 Dry eye syndrome of bilateral lacrimal glands: Secondary | ICD-10-CM | POA: Diagnosis not present

## 2017-10-24 DIAGNOSIS — H35373 Puckering of macula, bilateral: Secondary | ICD-10-CM | POA: Diagnosis not present

## 2017-10-24 DIAGNOSIS — H524 Presbyopia: Secondary | ICD-10-CM | POA: Diagnosis not present

## 2017-10-25 ENCOUNTER — Ambulatory Visit (INDEPENDENT_AMBULATORY_CARE_PROVIDER_SITE_OTHER): Payer: Medicare Other | Admitting: Clinical

## 2017-10-25 DIAGNOSIS — F330 Major depressive disorder, recurrent, mild: Secondary | ICD-10-CM | POA: Diagnosis not present

## 2017-10-26 ENCOUNTER — Ambulatory Visit: Payer: Medicare Other | Admitting: Clinical

## 2017-11-07 DIAGNOSIS — S92354D Nondisplaced fracture of fifth metatarsal bone, right foot, subsequent encounter for fracture with routine healing: Secondary | ICD-10-CM | POA: Diagnosis not present

## 2017-11-18 DIAGNOSIS — Z23 Encounter for immunization: Secondary | ICD-10-CM | POA: Diagnosis not present

## 2017-11-27 ENCOUNTER — Ambulatory Visit (INDEPENDENT_AMBULATORY_CARE_PROVIDER_SITE_OTHER): Payer: Medicare Other | Admitting: Clinical

## 2017-11-27 DIAGNOSIS — F331 Major depressive disorder, recurrent, moderate: Secondary | ICD-10-CM | POA: Diagnosis not present

## 2017-11-30 DIAGNOSIS — E039 Hypothyroidism, unspecified: Secondary | ICD-10-CM | POA: Diagnosis not present

## 2017-11-30 DIAGNOSIS — E785 Hyperlipidemia, unspecified: Secondary | ICD-10-CM | POA: Diagnosis not present

## 2017-11-30 DIAGNOSIS — M8580 Other specified disorders of bone density and structure, unspecified site: Secondary | ICD-10-CM | POA: Diagnosis not present

## 2017-12-01 DIAGNOSIS — H43812 Vitreous degeneration, left eye: Secondary | ICD-10-CM | POA: Diagnosis not present

## 2017-12-01 DIAGNOSIS — H25813 Combined forms of age-related cataract, bilateral: Secondary | ICD-10-CM | POA: Diagnosis not present

## 2017-12-01 DIAGNOSIS — H35373 Puckering of macula, bilateral: Secondary | ICD-10-CM | POA: Diagnosis not present

## 2017-12-04 ENCOUNTER — Ambulatory Visit (INDEPENDENT_AMBULATORY_CARE_PROVIDER_SITE_OTHER): Payer: Medicare Other | Admitting: Clinical

## 2017-12-04 DIAGNOSIS — F331 Major depressive disorder, recurrent, moderate: Secondary | ICD-10-CM

## 2017-12-05 DIAGNOSIS — E785 Hyperlipidemia, unspecified: Secondary | ICD-10-CM | POA: Diagnosis not present

## 2017-12-05 DIAGNOSIS — E039 Hypothyroidism, unspecified: Secondary | ICD-10-CM | POA: Diagnosis not present

## 2017-12-05 DIAGNOSIS — M8580 Other specified disorders of bone density and structure, unspecified site: Secondary | ICD-10-CM | POA: Diagnosis not present

## 2017-12-11 ENCOUNTER — Ambulatory Visit (INDEPENDENT_AMBULATORY_CARE_PROVIDER_SITE_OTHER): Payer: Medicare Other | Admitting: Clinical

## 2017-12-11 DIAGNOSIS — F331 Major depressive disorder, recurrent, moderate: Secondary | ICD-10-CM

## 2017-12-14 DIAGNOSIS — B079 Viral wart, unspecified: Secondary | ICD-10-CM | POA: Diagnosis not present

## 2017-12-14 DIAGNOSIS — D485 Neoplasm of uncertain behavior of skin: Secondary | ICD-10-CM | POA: Diagnosis not present

## 2017-12-14 DIAGNOSIS — Z85828 Personal history of other malignant neoplasm of skin: Secondary | ICD-10-CM | POA: Diagnosis not present

## 2017-12-20 ENCOUNTER — Ambulatory Visit (INDEPENDENT_AMBULATORY_CARE_PROVIDER_SITE_OTHER): Payer: Medicare Other | Admitting: Clinical

## 2017-12-20 DIAGNOSIS — F331 Major depressive disorder, recurrent, moderate: Secondary | ICD-10-CM | POA: Diagnosis not present

## 2017-12-27 ENCOUNTER — Ambulatory Visit (INDEPENDENT_AMBULATORY_CARE_PROVIDER_SITE_OTHER): Payer: Medicare Other | Admitting: Clinical

## 2017-12-27 DIAGNOSIS — F331 Major depressive disorder, recurrent, moderate: Secondary | ICD-10-CM | POA: Diagnosis not present

## 2018-01-03 ENCOUNTER — Ambulatory Visit (INDEPENDENT_AMBULATORY_CARE_PROVIDER_SITE_OTHER): Payer: Medicare Other | Admitting: Clinical

## 2018-01-03 DIAGNOSIS — F331 Major depressive disorder, recurrent, moderate: Secondary | ICD-10-CM | POA: Diagnosis not present

## 2018-01-11 ENCOUNTER — Ambulatory Visit (INDEPENDENT_AMBULATORY_CARE_PROVIDER_SITE_OTHER): Payer: Medicare Other | Admitting: Clinical

## 2018-01-11 DIAGNOSIS — F331 Major depressive disorder, recurrent, moderate: Secondary | ICD-10-CM | POA: Diagnosis not present

## 2018-01-24 ENCOUNTER — Ambulatory Visit (INDEPENDENT_AMBULATORY_CARE_PROVIDER_SITE_OTHER): Payer: Medicare Other | Admitting: Clinical

## 2018-01-24 DIAGNOSIS — F331 Major depressive disorder, recurrent, moderate: Secondary | ICD-10-CM | POA: Diagnosis not present

## 2018-01-25 ENCOUNTER — Ambulatory Visit (INDEPENDENT_AMBULATORY_CARE_PROVIDER_SITE_OTHER): Payer: Medicare Other | Admitting: Internal Medicine

## 2018-01-25 ENCOUNTER — Encounter: Payer: Self-pay | Admitting: Internal Medicine

## 2018-01-25 VITALS — BP 130/84 | HR 67 | Ht 65.0 in | Wt 189.8 lb

## 2018-01-25 DIAGNOSIS — R002 Palpitations: Secondary | ICD-10-CM

## 2018-01-25 NOTE — Patient Instructions (Signed)
Medication Instructions:  Your physician recommends that you continue on your current medications as directed. Please refer to the Current Medication list given to you today.  Labwork: None ordered.  Testing/Procedures:  Your physician recommends you have a Calcium scoring CT performed. This is a separate $150 flat fee not filed under your insurance.   Your physician has recommended that you wear a Zio monitor. Zio monitors are medical devices that record the heart's electrical activity. Doctors most often use these monitors to diagnose arrhythmias. Arrhythmias are problems with the speed or rhythm of the heartbeat. The monitor is a small, portable device. You can wear one while you do your normal daily activities. This is usually used to diagnose what is causing palpitations/syncope (passing out).   Follow-Up: Your physician recommends that you schedule a follow-up appointment in:    As Needed with Dr Caryl Comes  Any Other Special Instructions Will Be Listed Below (If Applicable).     If you need a refill on your cardiac medications before your next appointment, please call your pharmacy.

## 2018-01-25 NOTE — Progress Notes (Signed)
ELECTROPHYSIOLOGY CONSULT NOTE  Patient ID: Sarah Buchanan, MRN: [id number], DOB/AGE: [birth year] 67 y.o. Admit date: (Not on file) Date of Consult: 01/25/2018  Primary Physician: Crist Infante, MD Primary Cardiologist: new     Sarah Buchanan is a 67 y.o. female who is being seen today for the evaluation of palpitations at the request of Perini.    HPI Sarah Buchanan is a 67 y.o. 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  About 1 year ago, her Synthroid replacement dose was increased.  In the wake of that she started noticing increasing palpitations.  These are mostly notable at night and in bed.  They last 30-60 seconds.  They are unassociated with lightheadedness chest pain or shortness of breath.  It become increasingly frequent of late occurring now 3-7 times per week.  She notes no association with her infrequent use of alcohol or caffeine.  She is typically been quite fit until she hurt her ankle earlier this fall.  She had noted no change in exercise tolerance.  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    Past Medical History:  Diagnosis Date  . AK (actinic keratosis) 2012   LEFT NOSE   . Carcinoma of hippocampus ([place]) 2019  . Chondromalacia patellae of right knee 2013  . Dryness of eye    DUE TO THYROID   . Foot pain   . Fracture of metatarsal bone   . G2P2   . Ganglion cyst of wrist, right   . H/O varicose veins 2016  . Humerus fracture 2011   FELL ON ICE AND FRACTURED GREATER TUBEROSITY  . Hyperlipidemia   . Hypothyroidism   . Lumbar disc herniation   . Macular hole of right eye   . Menopause    AGE   . Osteopenia   . Perforated ear drum, left 07/2009  . Plantar fasciitis   . Retinal hemorrhage of right eye    10/9, 10/10, 3/10, 2013  . Squamous cell carcinoma in situ (SCCIS) of left foot 06/2014   ANKLE  . TGA (transient global amnesia) 10/2015  . Thumb fracture    AGE 3  . Varicose veins        Surgical History:  Past Surgical History:  Procedure Laterality Date  . CYSTECTOMY Left 1978  . EYE SURGERY  2008   PUNCTAL MENISCUS CLEANED  . MOHS SURGERY    . OTHER SURGICAL HISTORY  02/2015   EARDRUM PATCH  . TOTAL ABDOMINAL HYSTERECTOMY W/ BILATERAL SALPINGOOPHORECTOMY  1998     Home Meds: Current Meds  Medication Sig  . Calcium Citrate-Vitamin D (CALCIUM CITRATE + D PO) Take by mouth. Takes 600-500  daily  . Cholecalciferol (VITAMIN D-1000 MAX ST) 25 MCG (1000 UT) tablet Take 2,000 Int'l Units by mouth daily.  Marland Kitchen co-enzyme Q-10 50 MG capsule Take 300 mg by mouth 2 (two) times daily.   Marland Kitchen estradiol (VIVELLE-DOT) 0.05 MG/24HR patch Place 1 patch onto the skin as directed. Wears patch for 5 days a week  . Flaxseed Oil OIL 1,400 mg by Does not apply route daily.   Marland Kitchen levothyroxine (SYNTHROID) 112 MCG tablet Take 1 tablet by mouth as directed. Take 1 tablet by mouth 6 days a week, then 1/2 tablet by mouth once a week  . Multiple Vitamin (MULTIVITAMIN) capsule Take 1 capsule by mouth daily.  . Omega-3 Fatty Acids (FISH OIL) 1000 MG CAPS Take 1,280 mg by mouth daily.  Marland Kitchen  progesterone (PROMETRIUM) 100 MG capsule Take 1 capsule by mouth daily.  Marland Kitchen Propylene Glycol (SYSTANE COMPLETE OP) Apply 1 drop to eye 2 (two) times daily as needed.  . S-Adenosylmethionine (SAM-E) 400 MG TABS Take 1 tablet by mouth daily.  Marland Kitchen XIIDRA 5 % SOLN INT ONE GTT IN OU BID    Allergies: No Known Allergies  Social History   Socioeconomic History  . Marital status: Divorced    Spouse name: Not on file  . Number of children: Not on file  . Years of education: Not on file  . Highest education level: Not on file  Occupational History  . Not on file  Social Needs  . Financial resource strain: Not on file  . Food insecurity:    Worry: Not on file    Inability: Not on file  . Transportation needs:    Medical: Not on file    Non-medical: Not on file  Tobacco Use  . Smoking status: Never Smoker  .  Smokeless tobacco: Never Used  Substance and Sexual Activity  . Alcohol use: Yes    Alcohol/week: 1.0 standard drinks    Types: 1 Glasses of wine per week    Comment: last drink 11/2015  . Drug use: No  . Sexual activity: Not on file  Lifestyle  . Physical activity:    Days per week: Not on file    Minutes per session: Not on file  . Stress: Not on file  Relationships  . Social connections:    Talks on phone: Not on file    Gets together: Not on file    Attends religious service: Not on file    Active member of club or organization: Not on file    Attends meetings of clubs or organizations: Not on file    Relationship status: Not on file  . Intimate partner violence:    Fear of current or ex partner: Not on file    Emotionally abused: Not on file    Physically abused: Not on file    Forced sexual activity: Not on file  Other Topics Concern  . Not on file  Social History Narrative  . Not on file     Family History  Problem Relation Age of Onset  . Dementia Mother   . Stroke Father   . Lymphoma Brother      ROS:  Please see the history of present illness.     All other systems reviewed and negative.    Physical Exam: Blood pressure 130/84, pulse 67, height 5\' 5"  (1.651 m), weight 189 lb 12.8 oz (86.1 kg), SpO2 94 %. General: Well developed, well nourished female in no acute distress. Head: Normocephalic, atraumatic, sclera non-icteric, no xanthomas, nares are without discharge. EENT: normal  Lymph Nodes:  none Neck: Negative for carotid bruits. JVD not elevated. Back:without scoliosis kyphosis  Lungs: Clear bilaterally to auscultation without wheezes, rales, or rhonchi. Breathing is unlabored. Heart: RRR with S1 S2. No murmur . No rubs, or gallops appreciated. Abdomen: Soft, non-tender, non-distended with normoactive bowel sounds. No hepatomegaly. No rebound/guarding. No obvious abdominal masses. Msk:  Strength and tone appear normal for age. Extremities: No  clubbing or cyanosis. No  edema.  Distal pedal pulses are 2+ and equal bilaterally. Skin: Warm and Dry Neuro: Alert and oriented X 3. CN III-XII intact Grossly normal sensory and motor function . Psych:  Responds to questions appropriately with a normal affect.      Labs: Cardiac Enzymes No results  for input(s): CKTOTAL, CKMB, TROPONINI in the last 72 hours. CBC Lab Results  Component Value Date   WBC 7.0 10/15/2015   HGB 16.0 (H) 10/15/2015   HCT 47.0 (H) 10/15/2015   MCV 93.0 10/15/2015   PLT 206 10/15/2015   PROTIME: No results for input(s): LABPROT, INR in the last 72 hours. Chemistry No results for input(s): NA, K, CL, CO2, BUN, CREATININE, CALCIUM, PROT, BILITOT, ALKPHOS, ALT, AST, GLUCOSE in the last 168 hours.  Invalid input(s): LABALBU Lipids No results found for: CHOL, HDL, LDLCALC, TRIG BNP No results found for: PROBNP Thyroid Function Tests: No results for input(s): TSH, T4TOTAL, T3FREE, THYROIDAB in the last 72 hours.  Invalid input(s): FREET3 Miscellaneous No results found for: DDIMER  Radiology/Studies:  No results found.  EKG: Sinus at 67 Intervals 13/08/43 Axis 34 No preexcitation Normal   Assessment and Plan:  Palpitations  Treated hypothyroidism  Hyper lipidemia  Statin intolerance   The brevity of her palpitations make them likely clinically insignificant.  The issue my mind in this older woman is whether she has atrial fibrillation not withstanding the brevity of her symptoms.  It is well-known that atrial fibrillation is frequently poorly detected.  Hence, we will use a ZIO Patch to clarify the mechanism of her palpitations.  She also has hyperlipidemia.  Her ten-year risk score is north of 7-1/2%.  We discussed the role of calcium scoring as a risk stratify given her intolerance of statins.  She would like to do that.    Virl Axe

## 2018-01-31 ENCOUNTER — Ambulatory Visit (INDEPENDENT_AMBULATORY_CARE_PROVIDER_SITE_OTHER): Payer: Medicare Other | Admitting: Clinical

## 2018-01-31 DIAGNOSIS — F331 Major depressive disorder, recurrent, moderate: Secondary | ICD-10-CM | POA: Diagnosis not present

## 2018-02-07 ENCOUNTER — Ambulatory Visit (INDEPENDENT_AMBULATORY_CARE_PROVIDER_SITE_OTHER): Payer: Medicare Other | Admitting: Clinical

## 2018-02-07 DIAGNOSIS — F331 Major depressive disorder, recurrent, moderate: Secondary | ICD-10-CM | POA: Diagnosis not present

## 2018-02-14 ENCOUNTER — Ambulatory Visit: Payer: Medicare Other | Admitting: Clinical

## 2018-02-20 ENCOUNTER — Ambulatory Visit (INDEPENDENT_AMBULATORY_CARE_PROVIDER_SITE_OTHER)
Admission: RE | Admit: 2018-02-20 | Discharge: 2018-02-20 | Disposition: A | Discharge status: Home / Self Care | Payer: Self-pay | Source: Ambulatory Visit | Attending: Internal Medicine | Admitting: Internal Medicine

## 2018-02-20 ENCOUNTER — Encounter: Payer: Self-pay | Admitting: *Deleted

## 2018-02-20 ENCOUNTER — Ambulatory Visit (INDEPENDENT_AMBULATORY_CARE_PROVIDER_SITE_OTHER): Payer: Medicare Other

## 2018-02-20 DIAGNOSIS — R002 Palpitations: Secondary | ICD-10-CM | POA: Diagnosis not present

## 2018-02-20 NOTE — Progress Notes (Signed)
Patient ID: Sarah Buchanan, female   DOB: [birth year], 67 y.o.   MRN: [id number] A 14 day Zio Patch was ordered on this patient.  The patient swims 3 x's a week, which, would not be possible with the Zio patch, (water resistant but not water proof).  A Preventice 14 day long term holter monitor was used instead.  It is a completely waterproof monitor allowing the patient to continue with her swimming schedule.  The Preventice long term monitor will provide all the same data as the Zio, ie. pvc morphology, burden , onset and offset.

## 2018-02-21 ENCOUNTER — Ambulatory Visit (INDEPENDENT_AMBULATORY_CARE_PROVIDER_SITE_OTHER): Payer: Medicare Other | Admitting: Clinical

## 2018-02-21 DIAGNOSIS — F331 Major depressive disorder, recurrent, moderate: Secondary | ICD-10-CM

## 2018-03-14 ENCOUNTER — Ambulatory Visit: Payer: Medicare Other | Admitting: Clinical

## 2018-03-21 ENCOUNTER — Ambulatory Visit: Payer: Medicare Other | Admitting: Clinical

## 2018-03-28 ENCOUNTER — Ambulatory Visit (INDEPENDENT_AMBULATORY_CARE_PROVIDER_SITE_OTHER): Payer: Medicare Other | Admitting: Clinical

## 2018-03-28 ENCOUNTER — Telehealth: Payer: Self-pay | Admitting: Internal Medicine

## 2018-03-28 DIAGNOSIS — F331 Major depressive disorder, recurrent, moderate: Secondary | ICD-10-CM | POA: Diagnosis not present

## 2018-03-28 NOTE — Telephone Encounter (Signed)
New Message        Patient is calling today checking the status and results of the Holter monitor, patient mailed the Holter on 03/06/2018 and she not heard anything yet.

## 2018-03-29 NOTE — Telephone Encounter (Signed)
LVM for pt to discuss monitor results below (per Dr Caryl Comes):   Narrative & Impression    Indication: Palpitations  Duration: 14d  Findings VT nonsustained unassociated with symptoms SVT-nonsustained unassociated with symptoms   Symptoms of  Flutter primarily associated with sinus rhythm but occasionally with PACs coupled    Importantly no evidence of atrial fibrillation was detected

## 2018-03-29 NOTE — Telephone Encounter (Signed)
Spoke with pt regarding her monitor results. Pt understands she had no episodes of AF/Aflutter. She noted PACs w/symptom activator. Episodes of VT and SVT were not noted. She states she was unaware of these episodes. Pt plans on following up with her endocrinologist as she believes her palpitations and tachycardia have worsened since recent medication changes.   I also discussed pt's Calcium scoring test. Pts Ca Score was 0. Pt understands she will follow up with Dr Caryl Comes as needed and yearly with Dr Joylene Draft.  Pt verbalized understanding of the above and had no additional questions.

## 2018-04-04 ENCOUNTER — Ambulatory Visit: Payer: Medicare Other | Admitting: Clinical

## 2018-04-20 DIAGNOSIS — D2261 Melanocytic nevi of right upper limb, including shoulder: Secondary | ICD-10-CM | POA: Diagnosis not present

## 2018-04-20 DIAGNOSIS — D485 Neoplasm of uncertain behavior of skin: Secondary | ICD-10-CM | POA: Diagnosis not present

## 2018-04-20 DIAGNOSIS — L821 Other seborrheic keratosis: Secondary | ICD-10-CM | POA: Diagnosis not present

## 2018-04-20 DIAGNOSIS — L570 Actinic keratosis: Secondary | ICD-10-CM | POA: Diagnosis not present

## 2018-04-20 DIAGNOSIS — Z85828 Personal history of other malignant neoplasm of skin: Secondary | ICD-10-CM | POA: Diagnosis not present

## 2018-04-20 DIAGNOSIS — D1801 Hemangioma of skin and subcutaneous tissue: Secondary | ICD-10-CM | POA: Diagnosis not present

## 2018-04-20 DIAGNOSIS — L820 Inflamed seborrheic keratosis: Secondary | ICD-10-CM | POA: Diagnosis not present

## 2018-04-23 DIAGNOSIS — H7292 Unspecified perforation of tympanic membrane, left ear: Secondary | ICD-10-CM | POA: Diagnosis not present

## 2018-04-23 DIAGNOSIS — Z01118 Encounter for examination of ears and hearing with other abnormal findings: Secondary | ICD-10-CM | POA: Diagnosis not present

## 2018-04-23 DIAGNOSIS — H9202 Otalgia, left ear: Secondary | ICD-10-CM | POA: Diagnosis not present

## 2018-04-23 DIAGNOSIS — Z6833 Body mass index (BMI) 33.0-33.9, adult: Secondary | ICD-10-CM | POA: Diagnosis not present

## 2018-04-23 DIAGNOSIS — H9192 Unspecified hearing loss, left ear: Secondary | ICD-10-CM | POA: Diagnosis not present

## 2018-04-25 ENCOUNTER — Ambulatory Visit (INDEPENDENT_AMBULATORY_CARE_PROVIDER_SITE_OTHER): Payer: Medicare Other | Admitting: Clinical

## 2018-04-25 DIAGNOSIS — F331 Major depressive disorder, recurrent, moderate: Secondary | ICD-10-CM | POA: Diagnosis not present

## 2018-05-23 ENCOUNTER — Ambulatory Visit: Payer: Medicare Other | Admitting: Clinical

## 2018-06-27 ENCOUNTER — Ambulatory Visit: Payer: Medicare Other | Admitting: Clinical

## 2018-07-27 ENCOUNTER — Other Ambulatory Visit: Payer: Self-pay | Admitting: Internal Medicine

## 2018-07-27 DIAGNOSIS — Z1231 Encounter for screening mammogram for malignant neoplasm of breast: Secondary | ICD-10-CM

## 2018-08-27 DIAGNOSIS — Z6832 Body mass index (BMI) 32.0-32.9, adult: Secondary | ICD-10-CM | POA: Diagnosis not present

## 2018-08-27 DIAGNOSIS — H8111 Benign paroxysmal vertigo, right ear: Secondary | ICD-10-CM | POA: Diagnosis not present

## 2018-08-27 DIAGNOSIS — H7292 Unspecified perforation of tympanic membrane, left ear: Secondary | ICD-10-CM | POA: Diagnosis not present

## 2018-09-14 ENCOUNTER — Ambulatory Visit
Admission: RE | Admit: 2018-09-14 | Discharge: 2018-09-14 | Disposition: A | Discharge status: Home / Self Care | Payer: Medicare Other | Source: Ambulatory Visit | Attending: Internal Medicine | Admitting: Internal Medicine

## 2018-09-14 ENCOUNTER — Other Ambulatory Visit: Payer: Self-pay

## 2018-09-14 DIAGNOSIS — Z1231 Encounter for screening mammogram for malignant neoplasm of breast: Secondary | ICD-10-CM | POA: Diagnosis not present

## 2018-09-21 DIAGNOSIS — E038 Other specified hypothyroidism: Secondary | ICD-10-CM | POA: Diagnosis not present

## 2018-09-21 DIAGNOSIS — M859 Disorder of bone density and structure, unspecified: Secondary | ICD-10-CM | POA: Diagnosis not present

## 2018-09-21 DIAGNOSIS — E7849 Other hyperlipidemia: Secondary | ICD-10-CM | POA: Diagnosis not present

## 2018-09-26 DIAGNOSIS — R82998 Other abnormal findings in urine: Secondary | ICD-10-CM | POA: Diagnosis not present

## 2018-09-28 DIAGNOSIS — H3560 Retinal hemorrhage, unspecified eye: Secondary | ICD-10-CM | POA: Diagnosis not present

## 2018-09-28 DIAGNOSIS — E039 Hypothyroidism, unspecified: Secondary | ICD-10-CM | POA: Diagnosis not present

## 2018-09-28 DIAGNOSIS — Z0000 Encounter for general adult medical examination without abnormal findings: Secondary | ICD-10-CM | POA: Diagnosis not present

## 2018-09-28 DIAGNOSIS — H7290 Unspecified perforation of tympanic membrane, unspecified ear: Secondary | ICD-10-CM | POA: Diagnosis not present

## 2018-09-28 DIAGNOSIS — I499 Cardiac arrhythmia, unspecified: Secondary | ICD-10-CM | POA: Diagnosis not present

## 2018-09-28 DIAGNOSIS — Z1331 Encounter for screening for depression: Secondary | ICD-10-CM | POA: Diagnosis not present

## 2018-09-28 DIAGNOSIS — M8580 Other specified disorders of bone density and structure, unspecified site: Secondary | ICD-10-CM | POA: Diagnosis not present

## 2018-09-28 DIAGNOSIS — Z1339 Encounter for screening examination for other mental health and behavioral disorders: Secondary | ICD-10-CM | POA: Diagnosis not present

## 2018-09-28 DIAGNOSIS — Z780 Asymptomatic menopausal state: Secondary | ICD-10-CM | POA: Diagnosis not present

## 2018-09-28 DIAGNOSIS — E785 Hyperlipidemia, unspecified: Secondary | ICD-10-CM | POA: Diagnosis not present

## 2018-10-15 DIAGNOSIS — R351 Nocturia: Secondary | ICD-10-CM | POA: Diagnosis not present

## 2018-10-15 DIAGNOSIS — Z6831 Body mass index (BMI) 31.0-31.9, adult: Secondary | ICD-10-CM | POA: Diagnosis not present

## 2018-10-15 DIAGNOSIS — Z779 Other contact with and (suspected) exposures hazardous to health: Secondary | ICD-10-CM | POA: Diagnosis not present

## 2018-10-15 DIAGNOSIS — N959 Unspecified menopausal and perimenopausal disorder: Secondary | ICD-10-CM | POA: Diagnosis not present

## 2018-10-15 DIAGNOSIS — Z124 Encounter for screening for malignant neoplasm of cervix: Secondary | ICD-10-CM | POA: Diagnosis not present

## 2018-10-30 DIAGNOSIS — H524 Presbyopia: Secondary | ICD-10-CM | POA: Diagnosis not present

## 2018-10-30 DIAGNOSIS — H04123 Dry eye syndrome of bilateral lacrimal glands: Secondary | ICD-10-CM | POA: Diagnosis not present

## 2018-10-30 DIAGNOSIS — H25813 Combined forms of age-related cataract, bilateral: Secondary | ICD-10-CM | POA: Diagnosis not present

## 2018-10-30 DIAGNOSIS — H35343 Macular cyst, hole, or pseudohole, bilateral: Secondary | ICD-10-CM | POA: Diagnosis not present

## 2018-10-30 DIAGNOSIS — H35373 Puckering of macula, bilateral: Secondary | ICD-10-CM | POA: Diagnosis not present

## 2018-11-26 DIAGNOSIS — M545 Low back pain: Secondary | ICD-10-CM | POA: Diagnosis not present

## 2018-12-05 DIAGNOSIS — M545 Low back pain: Secondary | ICD-10-CM | POA: Diagnosis not present

## 2018-12-06 DIAGNOSIS — Z23 Encounter for immunization: Secondary | ICD-10-CM | POA: Diagnosis not present

## 2018-12-07 DIAGNOSIS — E785 Hyperlipidemia, unspecified: Secondary | ICD-10-CM | POA: Diagnosis not present

## 2018-12-07 DIAGNOSIS — E039 Hypothyroidism, unspecified: Secondary | ICD-10-CM | POA: Diagnosis not present

## 2018-12-07 DIAGNOSIS — M8580 Other specified disorders of bone density and structure, unspecified site: Secondary | ICD-10-CM | POA: Diagnosis not present

## 2018-12-11 DIAGNOSIS — M8580 Other specified disorders of bone density and structure, unspecified site: Secondary | ICD-10-CM | POA: Diagnosis not present

## 2018-12-11 DIAGNOSIS — E785 Hyperlipidemia, unspecified: Secondary | ICD-10-CM | POA: Diagnosis not present

## 2018-12-11 DIAGNOSIS — E039 Hypothyroidism, unspecified: Secondary | ICD-10-CM | POA: Diagnosis not present

## 2019-01-01 DIAGNOSIS — M545 Low back pain: Secondary | ICD-10-CM | POA: Diagnosis not present

## 2019-01-08 DIAGNOSIS — M545 Low back pain: Secondary | ICD-10-CM | POA: Diagnosis not present

## 2019-01-08 DIAGNOSIS — H8111 Benign paroxysmal vertigo, right ear: Secondary | ICD-10-CM | POA: Diagnosis not present

## 2019-01-10 DIAGNOSIS — M545 Low back pain: Secondary | ICD-10-CM | POA: Diagnosis not present

## 2019-01-10 DIAGNOSIS — H8111 Benign paroxysmal vertigo, right ear: Secondary | ICD-10-CM | POA: Diagnosis not present

## 2019-01-15 DIAGNOSIS — H8111 Benign paroxysmal vertigo, right ear: Secondary | ICD-10-CM | POA: Diagnosis not present

## 2019-01-15 DIAGNOSIS — M545 Low back pain: Secondary | ICD-10-CM | POA: Diagnosis not present

## 2019-01-17 DIAGNOSIS — H8111 Benign paroxysmal vertigo, right ear: Secondary | ICD-10-CM | POA: Diagnosis not present

## 2019-01-17 DIAGNOSIS — M545 Low back pain: Secondary | ICD-10-CM | POA: Diagnosis not present

## 2019-01-24 DIAGNOSIS — M545 Low back pain: Secondary | ICD-10-CM | POA: Diagnosis not present

## 2019-01-24 DIAGNOSIS — H8111 Benign paroxysmal vertigo, right ear: Secondary | ICD-10-CM | POA: Diagnosis not present

## 2019-01-29 DIAGNOSIS — M545 Low back pain: Secondary | ICD-10-CM | POA: Diagnosis not present

## 2019-01-29 DIAGNOSIS — H8111 Benign paroxysmal vertigo, right ear: Secondary | ICD-10-CM | POA: Diagnosis not present

## 2019-02-05 DIAGNOSIS — H8111 Benign paroxysmal vertigo, right ear: Secondary | ICD-10-CM | POA: Diagnosis not present

## 2019-02-05 DIAGNOSIS — M545 Low back pain: Secondary | ICD-10-CM | POA: Diagnosis not present

## 2019-02-12 DIAGNOSIS — M545 Low back pain: Secondary | ICD-10-CM | POA: Diagnosis not present

## 2019-02-12 DIAGNOSIS — H8111 Benign paroxysmal vertigo, right ear: Secondary | ICD-10-CM | POA: Diagnosis not present

## 2019-03-13 ENCOUNTER — Other Ambulatory Visit: Payer: Self-pay | Admitting: Physician Assistant

## 2019-03-13 DIAGNOSIS — M8580 Other specified disorders of bone density and structure, unspecified site: Secondary | ICD-10-CM

## 2019-03-15 DIAGNOSIS — H25813 Combined forms of age-related cataract, bilateral: Secondary | ICD-10-CM | POA: Diagnosis not present

## 2019-03-15 DIAGNOSIS — H04123 Dry eye syndrome of bilateral lacrimal glands: Secondary | ICD-10-CM | POA: Diagnosis not present

## 2019-03-15 DIAGNOSIS — H35373 Puckering of macula, bilateral: Secondary | ICD-10-CM | POA: Diagnosis not present

## 2019-03-27 ENCOUNTER — Ambulatory Visit
Admission: RE | Admit: 2019-03-27 | Discharge: 2019-03-27 | Disposition: A | Discharge status: Home / Self Care | Payer: Medicare Other | Source: Ambulatory Visit | Attending: Physician Assistant | Admitting: Physician Assistant

## 2019-03-27 ENCOUNTER — Other Ambulatory Visit: Payer: Self-pay

## 2019-03-27 DIAGNOSIS — M8580 Other specified disorders of bone density and structure, unspecified site: Secondary | ICD-10-CM

## 2019-03-27 DIAGNOSIS — Z780 Asymptomatic menopausal state: Secondary | ICD-10-CM | POA: Diagnosis not present

## 2019-03-27 DIAGNOSIS — M85852 Other specified disorders of bone density and structure, left thigh: Secondary | ICD-10-CM | POA: Diagnosis not present

## 2019-05-06 DIAGNOSIS — D2272 Melanocytic nevi of left lower limb, including hip: Secondary | ICD-10-CM | POA: Diagnosis not present

## 2019-05-06 DIAGNOSIS — D225 Melanocytic nevi of trunk: Secondary | ICD-10-CM | POA: Diagnosis not present

## 2019-05-06 DIAGNOSIS — L438 Other lichen planus: Secondary | ICD-10-CM | POA: Diagnosis not present

## 2019-05-06 DIAGNOSIS — L814 Other melanin hyperpigmentation: Secondary | ICD-10-CM | POA: Diagnosis not present

## 2019-05-06 DIAGNOSIS — L821 Other seborrheic keratosis: Secondary | ICD-10-CM | POA: Diagnosis not present

## 2019-05-06 DIAGNOSIS — L720 Epidermal cyst: Secondary | ICD-10-CM | POA: Diagnosis not present

## 2019-05-06 DIAGNOSIS — D1801 Hemangioma of skin and subcutaneous tissue: Secondary | ICD-10-CM | POA: Diagnosis not present

## 2019-05-06 DIAGNOSIS — L738 Other specified follicular disorders: Secondary | ICD-10-CM | POA: Diagnosis not present

## 2019-08-07 DIAGNOSIS — L720 Epidermal cyst: Secondary | ICD-10-CM | POA: Diagnosis not present

## 2019-08-13 ENCOUNTER — Other Ambulatory Visit: Payer: Self-pay | Admitting: Obstetrics and Gynecology

## 2019-08-13 DIAGNOSIS — Z1231 Encounter for screening mammogram for malignant neoplasm of breast: Secondary | ICD-10-CM

## 2019-08-19 IMAGING — MG DIGITAL SCREENING BILATERAL MAMMOGRAM WITH CAD
4 series · 4 of 4 positions shown · non-contrast
Comparison: Previous exam(s).

CLINICAL DATA: Screening.

EXAM:
DIGITAL SCREENING BILATERAL MAMMOGRAM WITH CAD

[R CC]
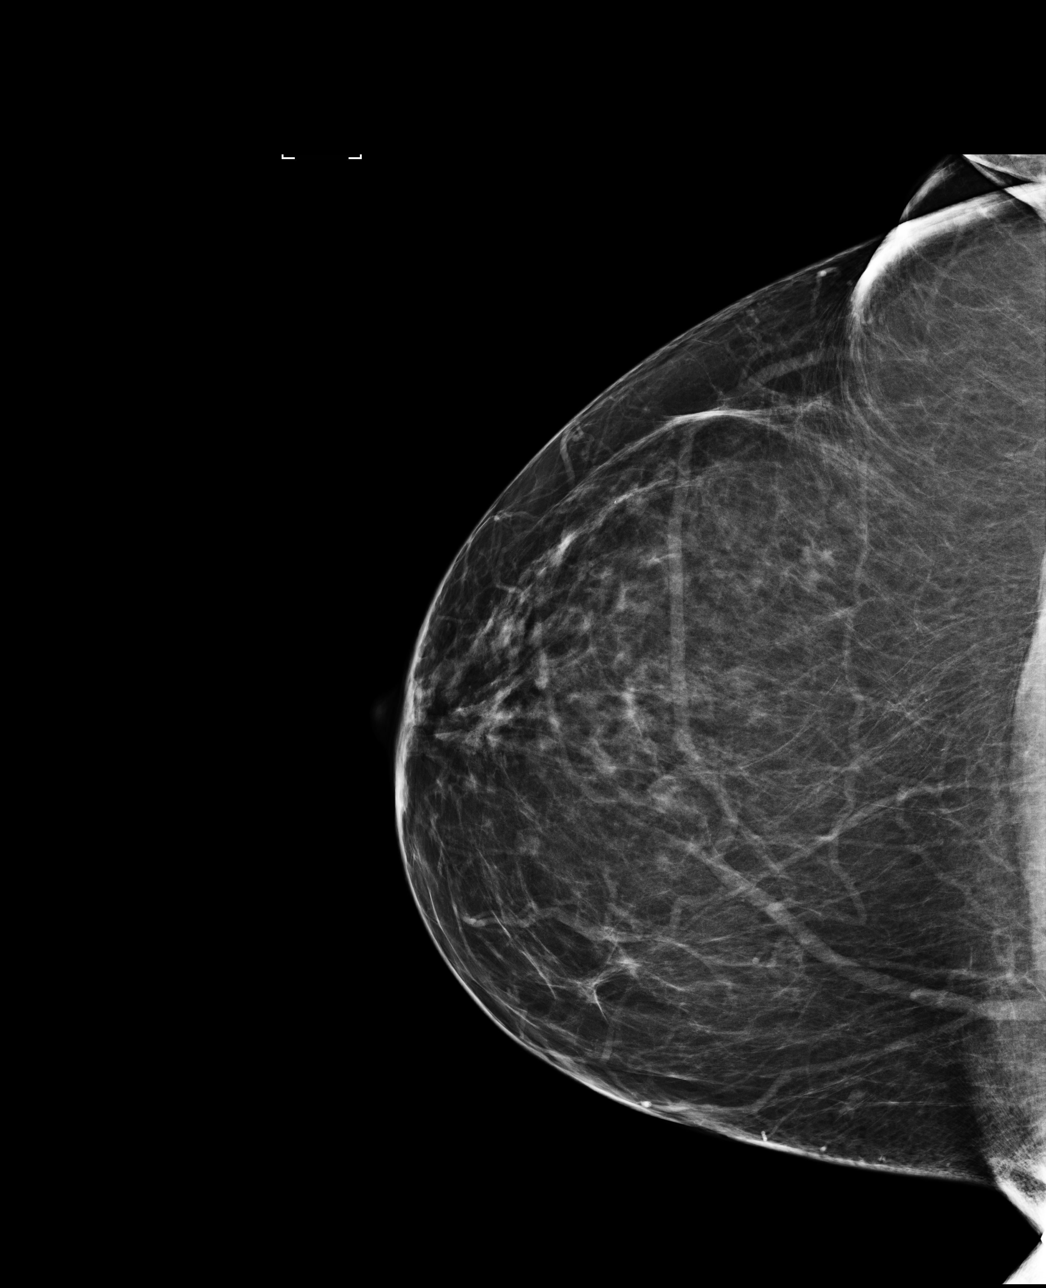

[R MLO]
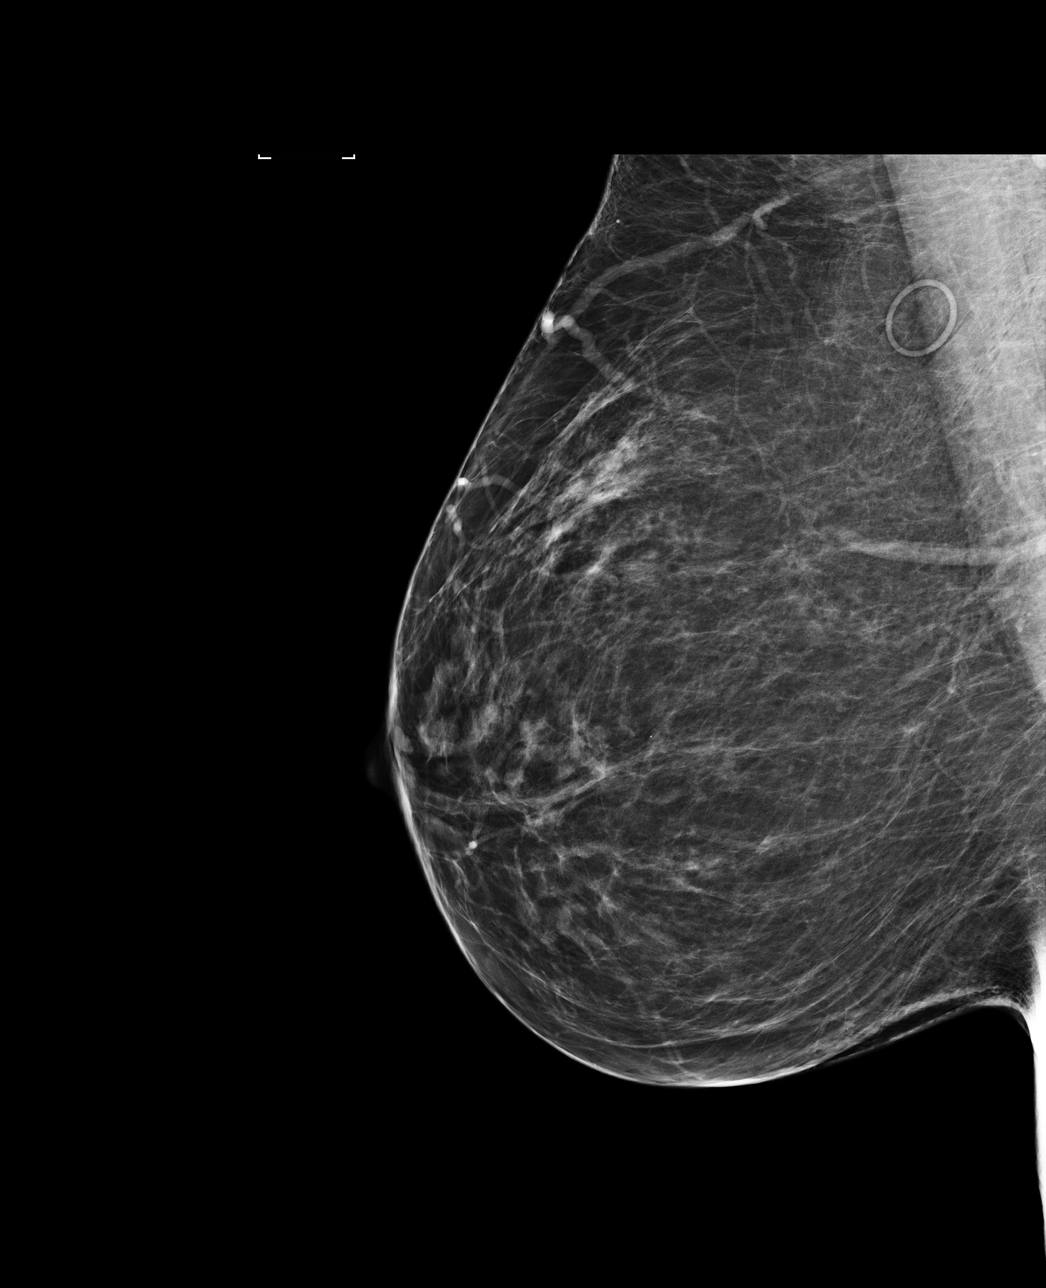

[L MLO]
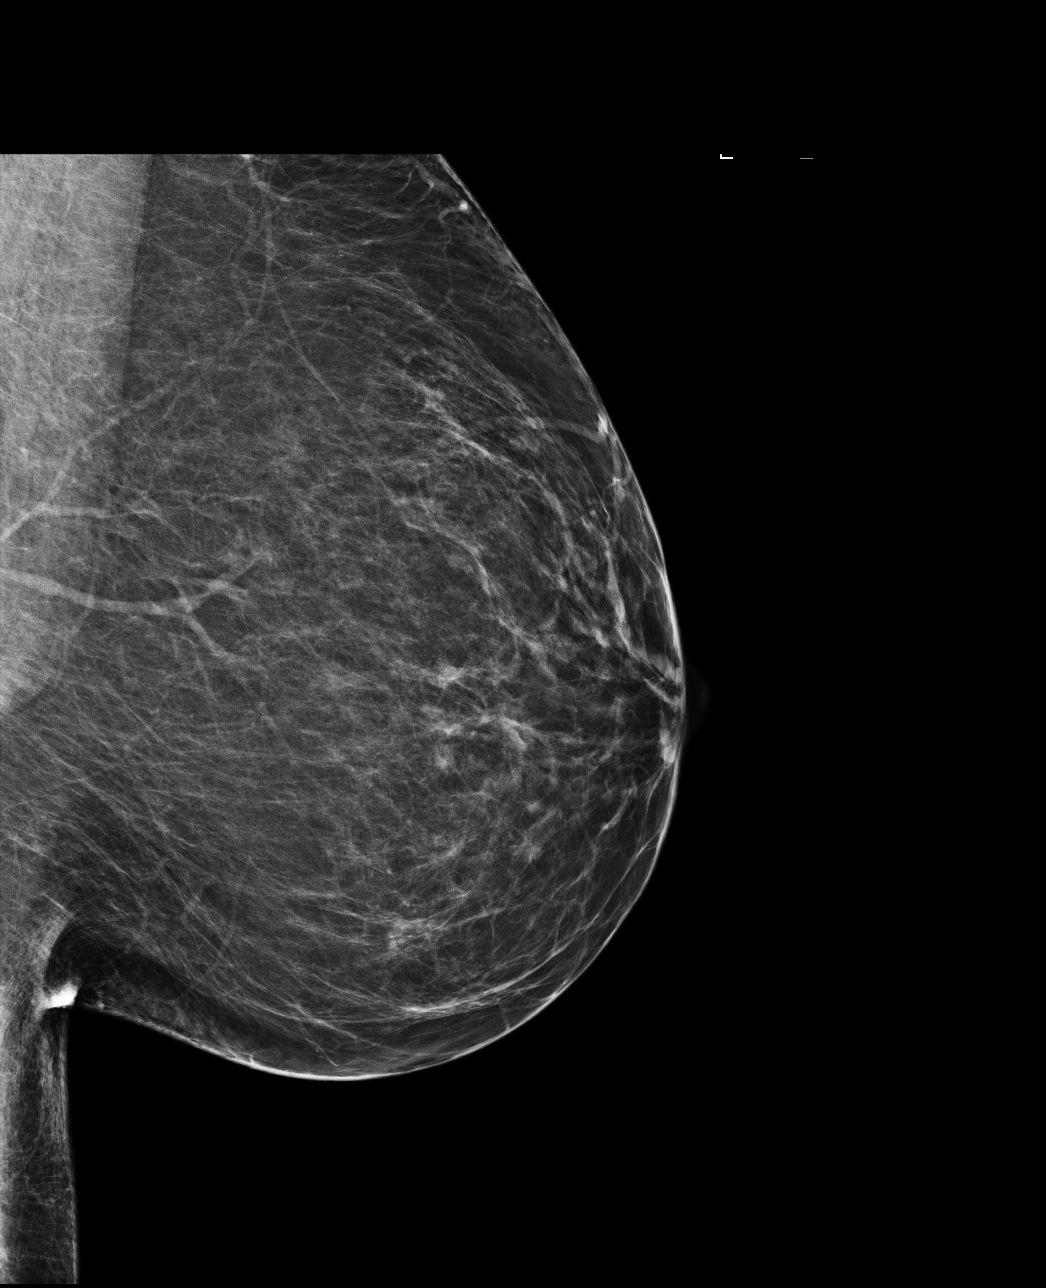

[L CC]
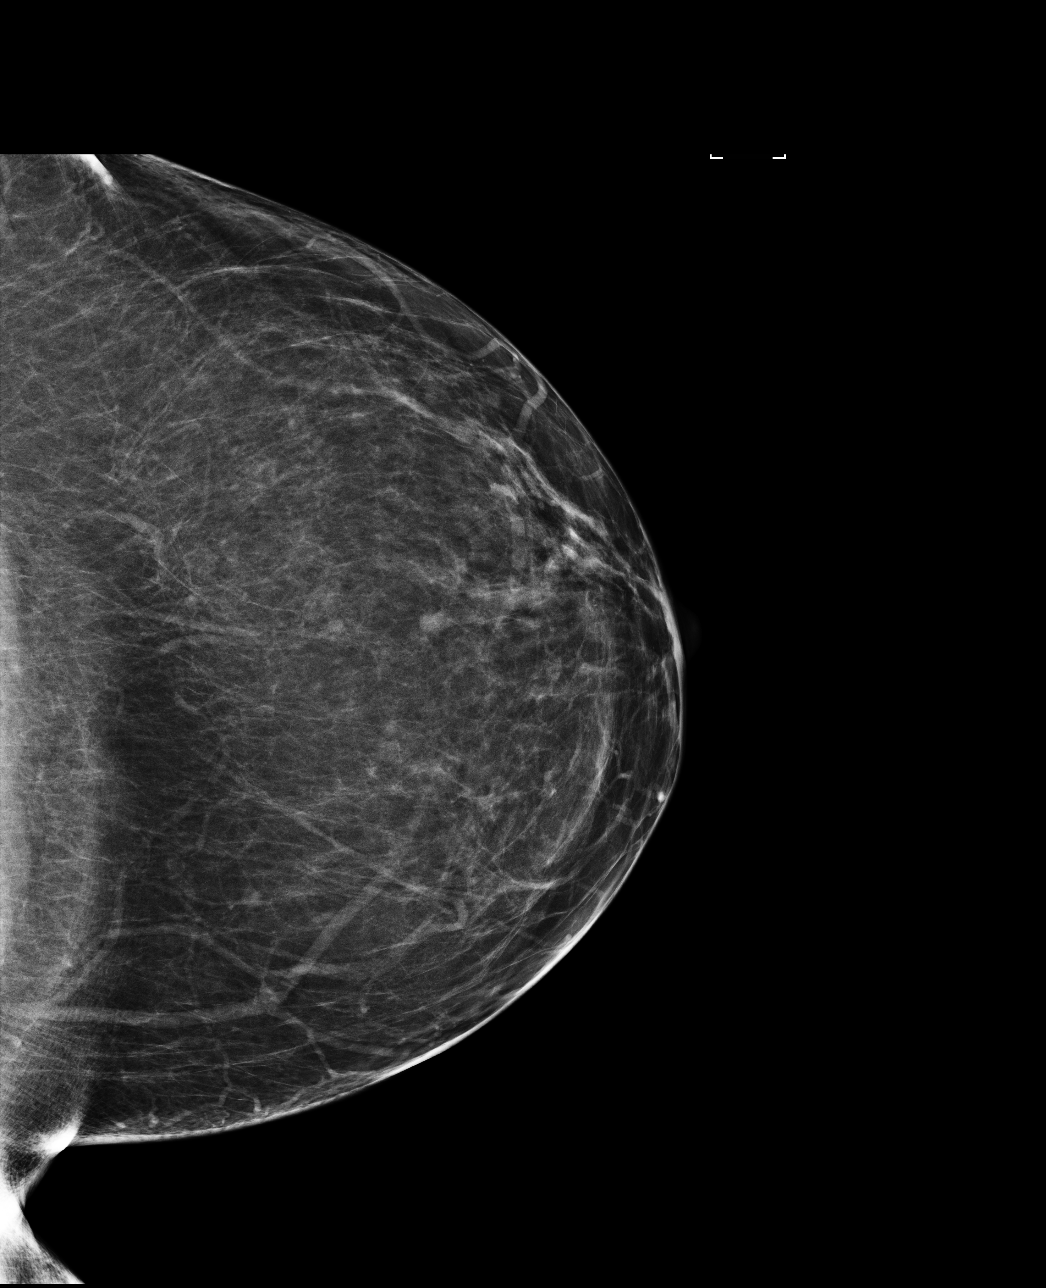

[4 of 4 positions shown; findings below may reference images not displayed]

ACR Breast Density Category b: There are scattered areas of
fibroglandular density.
FINDINGS: There are no findings suspicious for malignancy. Images were
processed with CAD.
IMPRESSION: No mammographic evidence of malignancy. A result letter of this
screening mammogram will be mailed directly to the patient.

RECOMMENDATION:
Screening mammogram in one year. (Code:AS-G-LCT)

BI-RADS CATEGORY  1: Negative.

## 2019-09-17 ENCOUNTER — Other Ambulatory Visit: Payer: Self-pay

## 2019-09-17 ENCOUNTER — Ambulatory Visit
Admission: RE | Admit: 2019-09-17 | Discharge: 2019-09-17 | Disposition: A | Discharge status: Home / Self Care | Payer: Medicare Other | Source: Ambulatory Visit

## 2019-09-17 DIAGNOSIS — Z1231 Encounter for screening mammogram for malignant neoplasm of breast: Secondary | ICD-10-CM

## 2019-09-30 DIAGNOSIS — M859 Disorder of bone density and structure, unspecified: Secondary | ICD-10-CM | POA: Diagnosis not present

## 2019-09-30 DIAGNOSIS — E038 Other specified hypothyroidism: Secondary | ICD-10-CM | POA: Diagnosis not present

## 2019-09-30 DIAGNOSIS — E7849 Other hyperlipidemia: Secondary | ICD-10-CM | POA: Diagnosis not present

## 2019-10-07 DIAGNOSIS — M8580 Other specified disorders of bone density and structure, unspecified site: Secondary | ICD-10-CM | POA: Diagnosis not present

## 2019-10-07 DIAGNOSIS — E785 Hyperlipidemia, unspecified: Secondary | ICD-10-CM | POA: Diagnosis not present

## 2019-10-07 DIAGNOSIS — Z780 Asymptomatic menopausal state: Secondary | ICD-10-CM | POA: Diagnosis not present

## 2019-10-07 DIAGNOSIS — H04129 Dry eye syndrome of unspecified lacrimal gland: Secondary | ICD-10-CM | POA: Diagnosis not present

## 2019-10-07 DIAGNOSIS — Z0000 Encounter for general adult medical examination without abnormal findings: Secondary | ICD-10-CM | POA: Diagnosis not present

## 2019-10-07 DIAGNOSIS — H3560 Retinal hemorrhage, unspecified eye: Secondary | ICD-10-CM | POA: Diagnosis not present

## 2019-10-07 DIAGNOSIS — I499 Cardiac arrhythmia, unspecified: Secondary | ICD-10-CM | POA: Diagnosis not present

## 2019-10-07 DIAGNOSIS — M722 Plantar fascial fibromatosis: Secondary | ICD-10-CM | POA: Diagnosis not present

## 2019-10-07 DIAGNOSIS — R82998 Other abnormal findings in urine: Secondary | ICD-10-CM | POA: Diagnosis not present

## 2019-10-07 DIAGNOSIS — Z1212 Encounter for screening for malignant neoplasm of rectum: Secondary | ICD-10-CM | POA: Diagnosis not present

## 2019-10-07 DIAGNOSIS — E039 Hypothyroidism, unspecified: Secondary | ICD-10-CM | POA: Diagnosis not present

## 2019-10-15 DIAGNOSIS — L720 Epidermal cyst: Secondary | ICD-10-CM | POA: Diagnosis not present

## 2019-10-15 DIAGNOSIS — D171 Benign lipomatous neoplasm of skin and subcutaneous tissue of trunk: Secondary | ICD-10-CM | POA: Diagnosis not present

## 2019-10-15 DIAGNOSIS — L905 Scar conditions and fibrosis of skin: Secondary | ICD-10-CM | POA: Diagnosis not present

## 2019-10-16 DIAGNOSIS — Z01419 Encounter for gynecological examination (general) (routine) without abnormal findings: Secondary | ICD-10-CM | POA: Diagnosis not present

## 2019-10-16 DIAGNOSIS — Z6828 Body mass index (BMI) 28.0-28.9, adult: Secondary | ICD-10-CM | POA: Diagnosis not present

## 2019-10-16 DIAGNOSIS — N959 Unspecified menopausal and perimenopausal disorder: Secondary | ICD-10-CM | POA: Diagnosis not present

## 2019-11-08 DIAGNOSIS — H35373 Puckering of macula, bilateral: Secondary | ICD-10-CM | POA: Diagnosis not present

## 2019-11-08 DIAGNOSIS — H04123 Dry eye syndrome of bilateral lacrimal glands: Secondary | ICD-10-CM | POA: Diagnosis not present

## 2019-11-08 DIAGNOSIS — H25813 Combined forms of age-related cataract, bilateral: Secondary | ICD-10-CM | POA: Diagnosis not present

## 2019-11-08 DIAGNOSIS — L719 Rosacea, unspecified: Secondary | ICD-10-CM | POA: Diagnosis not present

## 2019-12-10 DIAGNOSIS — E785 Hyperlipidemia, unspecified: Secondary | ICD-10-CM | POA: Diagnosis not present

## 2019-12-10 DIAGNOSIS — M8580 Other specified disorders of bone density and structure, unspecified site: Secondary | ICD-10-CM | POA: Diagnosis not present

## 2019-12-10 DIAGNOSIS — E039 Hypothyroidism, unspecified: Secondary | ICD-10-CM | POA: Diagnosis not present

## 2019-12-10 DIAGNOSIS — Z23 Encounter for immunization: Secondary | ICD-10-CM | POA: Diagnosis not present

## 2019-12-12 ENCOUNTER — Encounter: Payer: Self-pay | Admitting: Plastic Surgery

## 2019-12-12 ENCOUNTER — Other Ambulatory Visit: Payer: Self-pay

## 2019-12-12 ENCOUNTER — Ambulatory Visit (INDEPENDENT_AMBULATORY_CARE_PROVIDER_SITE_OTHER): Payer: Medicare Other | Admitting: Plastic Surgery

## 2019-12-12 VITALS — BP 133/80 | HR 54 | Temp 99.0°F | Ht 65.5 in | Wt 171.2 lb

## 2019-12-12 DIAGNOSIS — D171 Benign lipomatous neoplasm of skin and subcutaneous tissue of trunk: Secondary | ICD-10-CM

## 2019-12-12 NOTE — Progress Notes (Signed)
Referring Provider Molli Posey, [address],  [place] [address]   CC:  Chief Complaint  Patient presents with  . Advice Only      Sarah Buchanan is an 69 y.o. female.  HPI: Patient presents with a mass on her right mid back.  She believes is getting larger.  Is been present for a number of years.  It irritates her as it lies directly in her bra strap area.  It is intermittently painful when she lies on it.  She is interested in having it excised.  No Known Allergies  Outpatient Encounter Medications as of 12/12/2019  Medication Sig Note  . Calcium Citrate-Vitamin D (CALCIUM CITRATE + D PO) Take by mouth. Takes 600-500  daily   . Cholecalciferol (VITAMIN D-1000 MAX ST) 25 MCG (1000 UT) tablet Take 2,000 Int'l Units by mouth daily.   Marland Kitchen co-enzyme Q-10 50 MG capsule Take 300 mg by mouth 2 (two) times daily.    Marland Kitchen estradiol (VIVELLE-DOT) 0.05 MG/24HR patch Place 1 patch onto the skin as directed. Wears patch for 5 days a week   . Flaxseed Oil OIL 1,400 mg by Does not apply route daily.    Marland Kitchen levothyroxine (SYNTHROID) 112 MCG tablet Take 1 tablet by mouth as directed. Take 1 tablet by mouth 6 days a week, then 1/2 tablet by mouth once a week   . Multiple Vitamin (MULTIVITAMIN) capsule Take 1 capsule by mouth daily.   . Omega-3 Fatty Acids (FISH OIL) 1000 MG CAPS Take 1,280 mg by mouth daily.   . progesterone (PROMETRIUM) 100 MG capsule Take 1 capsule by mouth daily.   Marland Kitchen Propylene Glycol (SYSTANE COMPLETE OP) Apply 1 drop to eye 2 (two) times daily as needed.   . S-Adenosylmethionine (SAM-E) 400 MG TABS Take 1 tablet by mouth daily.   Marland Kitchen XIIDRA 5 % SOLN INT ONE GTT IN OU BID 01/22/2016: Received from: External Pharmacy   No facility-administered encounter medications on file as of 12/12/2019.     Past Medical History:  Diagnosis Date  . AK (actinic keratosis) 2012   LEFT NOSE   . Carcinoma of hippocampus ([place]) 2019  . Chondromalacia patellae of  right knee 2013  . Dryness of eye    DUE TO THYROID   . Foot pain   . Fracture of metatarsal bone   . G2P2   . Ganglion cyst of wrist, right   . H/O varicose veins 2016  . Humerus fracture 2011   FELL ON ICE AND FRACTURED GREATER TUBEROSITY  . Hyperlipidemia   . Hypothyroidism   . Lumbar disc herniation   . Macular hole of right eye   . Menopause    AGE   . Osteopenia   . Perforated ear drum, left 07/2009  . Plantar fasciitis   . Retinal hemorrhage of right eye    10/9, 10/10, 3/10, 2013  . Squamous cell carcinoma in situ (SCCIS) of left foot 06/2014   ANKLE  . TGA (transient global amnesia) 10/2015  . Thumb fracture    AGE 55  . Varicose veins     Past Surgical History:  Procedure Laterality Date  . CYSTECTOMY Left 1978  . EYE SURGERY  2008   PUNCTAL MENISCUS CLEANED  . MOHS SURGERY    . OTHER SURGICAL HISTORY  02/2015   EARDRUM PATCH  . TOTAL ABDOMINAL HYSTERECTOMY W/ BILATERAL SALPINGOOPHORECTOMY  1998    Family History  Problem Relation Age of Onset  .  Dementia Mother   . Stroke Father   . Lymphoma Brother     Social History   Social History Narrative  . Not on file     Review of Systems General: Denies fevers, chills, weight loss CV: Denies chest pain, shortness of breath, palpitations  Physical Exam Vitals with BMI 12/12/2019 01/25/2018 05/31/2016  Height 5' 5.5" 5\' 5"  5\' 5"   Weight 171 lbs 3 oz 189 lbs 13 oz 192 lbs 6 oz  BMI 28.05 60.73 71.0  Systolic 626 948 546  Diastolic 80 84 75  Pulse 54 67 73    General:  No acute distress,  Alert and oriented, Non-Toxic, Normal speech and affect Examination shows what feels like a 10 cm subcutaneous mass in the right mid back.  It is freely mobile.  It feels well encapsulated.  There is no overlying skin changes.  Assessment/Plan Patient presents with a subcutaneous mass in the right mid back.  I expect this is a lipoma.  We discussed excision in the operating room.  We discussed the risks include  bleeding, infection, damage surrounding structures and need for additional procedures.  We discussed the expected postoperative recovery.  We will plan to set this up at her convenience.  [name] 12/12/2019, 11:06 AM

## 2019-12-24 ENCOUNTER — Other Ambulatory Visit: Payer: Self-pay

## 2019-12-24 ENCOUNTER — Encounter (HOSPITAL_BASED_OUTPATIENT_CLINIC_OR_DEPARTMENT_OTHER): Payer: Self-pay | Admitting: Plastic Surgery

## 2019-12-24 NOTE — Progress Notes (Signed)
ICD-10-CM   1. Lipoma of back  D17.1       Patient ID: Sarah Buchanan, female    DOB: 1951-01-04, 69 y.o.   MRN: [id number]   History of Present Illness: Sarah Buchanan is a 69 y.o.  female  with a history of lipoma on her right mid back.  She presents for preoperative evaluation for upcoming procedure, excision of back lipoma, scheduled for 10/26 with Dr. Claudia Desanctis.  Summary from previous visit: Patient has an approximately 10 cm subcutaneous mass on her right mid back that she believes is getting larger.  It has been present for a number of years and irritates her as it lies directly under her bra strap.  It is intermittently painful when she lies on it.  It is freely mobile and feels well encapsulated.  There are no overlying skin changes.  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   Past Medical History: Allergies: No Known Allergies  Current Medications:  Current Outpatient Medications:  .  Calcium Citrate-Vitamin D (CALCIUM CITRATE + D PO), Take by mouth. Takes 600-500  daily, Disp: , Rfl:  .  Cholecalciferol (VITAMIN D-1000 MAX ST) 25 MCG (1000 UT) tablet, Take 2,000 Int'l Units by mouth daily., Disp: , Rfl:  .  co-enzyme Q-10 50 MG capsule, Take 300 mg by mouth 2 (two) times daily. , Disp: , Rfl:  .  estradiol (VIVELLE-DOT) 0.05 MG/24HR patch, Place 1 patch onto the skin as directed. Wears patch for 5 days a week, Disp: , Rfl:  .  levothyroxine (SYNTHROID) 112 MCG tablet, Take 1 tablet by mouth as directed. Take 1 tablet by mouth 6 days a week, then 1/2 tablet by mouth once a week, Disp: , Rfl:  .  Multiple Vitamin (MULTIVITAMIN) capsule, Take 1 capsule by mouth daily., Disp: , Rfl:  .  Omega-3 Fatty Acids (FISH OIL) 1000 MG CAPS, Take 1,280 mg by mouth daily., Disp: , Rfl:  .  progesterone (PROMETRIUM) 100 MG capsule, Take 1 capsule by mouth  daily., Disp: , Rfl:  .  Propylene Glycol (SYSTANE COMPLETE OP), Apply 1 drop to eye 2 (two) times daily as needed., Disp: , Rfl:  .  Red Yeast Rice 600 MG CAPS, Take by mouth., Disp: , Rfl:  .  S-Adenosylmethionine (SAM-E) 400 MG TABS, Take 1 tablet by mouth daily., Disp: , Rfl:  .  XIIDRA 5 % SOLN, INT ONE GTT IN OU BID, Disp: , Rfl: 0  Past Medical Problems: Past Medical History:  Diagnosis Date  . AK (actinic keratosis) 2012   LEFT NOSE   . Chondromalacia patellae of right knee 2013  . Dryness of eye    DUE TO THYROID   . Foot pain   . Fracture of metatarsal bone   . G2P2   . Ganglion cyst of wrist, right   . H/O varicose veins 2016  . Humerus fracture 2011   FELL ON ICE AND FRACTURED GREATER TUBEROSITY  . Hyperlipidemia   . Hypothyroidism   . Lumbar disc herniation   . Macular hole of right eye   . Menopause    AGE   . Osteopenia   . Perforated ear drum, left 07/2009  . Plantar fasciitis   . Retinal hemorrhage of right eye    10/9, 10/10, 3/10, 2013  . Squamous cell carcinoma in situ (SCCIS) of left foot 06/2014  ANKLE  . TGA (transient global amnesia) 10/2015  . Thumb fracture    AGE 42  . Varicose veins     Past Surgical History: Past Surgical History:  Procedure Laterality Date  . APPENDECTOMY    . CYSTECTOMY Left 1978   left ovary removed  . EYE SURGERY  2008   PUNCTAL MENISCUS CLEANED  . MOHS SURGERY    . OTHER SURGICAL HISTORY  02/2015   EARDRUM PATCH    Social History: Social History   Socioeconomic History  . Marital status: Divorced    Spouse name: Not on file  . Number of children: Not on file  . Years of education: Not on file  . Highest education level: Not on file  Occupational History  . Not on file  Tobacco Use  . Smoking status: Never Smoker  . Smokeless tobacco: Never Used  Substance and Sexual Activity  . Alcohol use: Not Currently    Alcohol/week: 1.0 standard drink    Types: 1 Glasses of wine per week    Comment: last  drink 11/2015  . Drug use: No  . Sexual activity: Not on file  Other Topics Concern  . Not on file  Social History Narrative  . Not on file   Social Determinants of Health   Financial Resource Strain:   . Difficulty of Paying Living Expenses: Not on file  Food Insecurity:   . Worried About Charity fundraiser in the Last Year: Not on file  . Ran Out of Food in the Last Year: Not on file  Transportation Needs:   . Lack of Transportation (Medical): Not on file  . Lack of Transportation (Non-Medical): Not on file  Physical Activity:   . Days of Exercise per Week: Not on file  . Minutes of Exercise per Session: Not on file  Stress:   . Feeling of Stress : Not on file  Social Connections:   . Frequency of Communication with Friends and Family: Not on file  . Frequency of Social Gatherings with Friends and Family: Not on file  . Attends Religious Services: Not on file  . Active Member of Clubs or Organizations: Not on file  . Attends Archivist Meetings: Not on file  . Marital Status: Not on file  Intimate Partner Violence:   . Fear of Current or Ex-Partner: Not on file  . Emotionally Abused: Not on file  . Physically Abused: Not on file  . Sexually Abused: Not on file    Family History: Family History  Problem Relation Age of Onset  . Dementia Mother   . Stroke Father   . Lymphoma Brother     Review of Systems: Review of Systems  Constitutional: Negative for chills and fever.  HENT: Negative for congestion and sore throat.   Respiratory: Negative for cough and shortness of breath.   Cardiovascular: Negative for chest pain.  Gastrointestinal: Negative for abdominal pain, nausea and vomiting.  Skin: Negative for itching and rash.       Mass on right mid-back    Physical Exam: Vital Signs BP 132/80 (BP Location: Left Arm, Patient Position: Sitting, Cuff Size: Normal)   Pulse (!) 54   Temp 98.3 F (36.8 C) (Temporal)   Ht 5' 5.5" (1.664 m)   Wt 165 lb  (74.8 kg)   SpO2 100%   BMI 27.04 kg/m  Physical Exam Constitutional:      General: She is not in acute distress.    Appearance: Normal  appearance. She is normal weight. She is not ill-appearing.  HENT:     Head: Normocephalic and atraumatic.  Eyes:     Extraocular Movements: Extraocular movements intact.  Cardiovascular:     Rate and Rhythm: Normal rate and regular rhythm.     Pulses: Normal pulses.     Heart sounds: Normal heart sounds.  Pulmonary:     Effort: Pulmonary effort is normal.     Breath sounds: Normal breath sounds. No wheezing, rhonchi or rales.  Abdominal:     General: Bowel sounds are normal.     Palpations: Abdomen is soft.  Musculoskeletal:        General: No swelling. Normal range of motion.     Cervical back: Normal range of motion.  Skin:    General: Skin is warm and dry.     Coloration: Skin is not pale.     Findings: No erythema or rash.     Comments: ~ 10 cm freely movable soft mass in right mid-back.   Neurological:     General: No focal deficit present.     Mental Status: She is alert and oriented to person, place, and time.  Psychiatric:        Mood and Affect: Mood normal.        Behavior: Behavior normal.        Thought Content: Thought content normal.        Judgment: Judgment normal.     Assessment/Plan:  Sarah Buchanan scheduled for excision of back lipoma (right) with Dr. Claudia Desanctis.  Risks, benefits, and alternatives of procedure discussed, questions answered and consent obtained.    Smoking Status: non-smoker; Counseling Given? N/A  Caprini Score: 6 High; Risk Factors include: 69 year old female, varicose veins, HRT, BMI > 25, and length of planned surgery. Recommendation for mechanical and pharmacological prophylaxis during surgery. Encourage early ambulation.   Post-op Rx sent to pharmacy: none - patient plans to manage on tylenol and ibuprofen. Will call if she needs additional pain medication.  Patient was provided with the General  Surgical Risk consent document and Pain Medication Agreement prior to their appointment.  They had adequate time to read through the risk consent documents and Pain Medication Agreement. We also discussed them in person together during this preop appointment. All of their questions were answered to their satisfaction.  Recommended calling if they have any further questions.  Risk consent form and Pain Medication Agreement to be scanned into patient's chart.  Electronically signed by: Threasa Heads, PA-C 12/25/2019 10:08 AM

## 2019-12-24 NOTE — H&P (View-Only) (Signed)
ICD-10-CM   1. Lipoma of back  D17.1       Patient ID: Sarah Buchanan, female    DOB: [birth year], 69 y.o.   MRN: [id number]   History of Present Illness: Sarah Buchanan is a 69 y.o.  female  with a history of lipoma on her right mid back.  She presents for preoperative evaluation for upcoming procedure, excision of back lipoma, scheduled for 10/26 with Dr. Claudia Desanctis.  Summary from previous visit: Patient has an approximately 10 cm subcutaneous mass on her right mid back that she believes is getting larger.  It has been present for a number of years and irritates her as it lies directly under her bra strap.  It is intermittently painful when she lies on it.  It is freely mobile and feels well encapsulated.  There are no overlying skin changes.  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   Past Medical History: Allergies: No Known Allergies  Current Medications:  Current Outpatient Medications:  .  Calcium Citrate-Vitamin D (CALCIUM CITRATE + D PO), Take by mouth. Takes 600-500  daily, Disp: , Rfl:  .  Cholecalciferol (VITAMIN D-1000 MAX ST) 25 MCG (1000 UT) tablet, Take 2,000 Int'l Units by mouth daily., Disp: , Rfl:  .  co-enzyme Q-10 50 MG capsule, Take 300 mg by mouth 2 (two) times daily. , Disp: , Rfl:  .  estradiol (VIVELLE-DOT) 0.05 MG/24HR patch, Place 1 patch onto the skin as directed. Wears patch for 5 days a week, Disp: , Rfl:  .  levothyroxine (SYNTHROID) 112 MCG tablet, Take 1 tablet by mouth as directed. Take 1 tablet by mouth 6 days a week, then 1/2 tablet by mouth once a week, Disp: , Rfl:  .  Multiple Vitamin (MULTIVITAMIN) capsule, Take 1 capsule by mouth daily., Disp: , Rfl:  .  Omega-3 Fatty Acids (FISH OIL) 1000 MG CAPS, Take 1,280 mg by mouth daily., Disp: , Rfl:  .  progesterone (PROMETRIUM) 100 MG capsule, Take 1 capsule by mouth  daily., Disp: , Rfl:  .  Propylene Glycol (SYSTANE COMPLETE OP), Apply 1 drop to eye 2 (two) times daily as needed., Disp: , Rfl:  .  Red Yeast Rice 600 MG CAPS, Take by mouth., Disp: , Rfl:  .  S-Adenosylmethionine (SAM-E) 400 MG TABS, Take 1 tablet by mouth daily., Disp: , Rfl:  .  XIIDRA 5 % SOLN, INT ONE GTT IN OU BID, Disp: , Rfl: 0  Past Medical Problems: Past Medical History:  Diagnosis Date  . AK (actinic keratosis) 2012   LEFT NOSE   . Chondromalacia patellae of right knee 2013  . Dryness of eye    DUE TO THYROID   . Foot pain   . Fracture of metatarsal bone   . G2P2   . Ganglion cyst of wrist, right   . H/O varicose veins 2016  . Humerus fracture 2011   FELL ON ICE AND FRACTURED GREATER TUBEROSITY  . Hyperlipidemia   . Hypothyroidism   . Lumbar disc herniation   . Macular hole of right eye   . Menopause    AGE   . Osteopenia   . Perforated ear drum, left 07/2009  . Plantar fasciitis   . Retinal hemorrhage of right eye    10/9, 10/10, 3/10, 2013  . Squamous cell carcinoma in situ (SCCIS) of left foot 06/2014  ANKLE  . TGA (transient global amnesia) 10/2015  . Thumb fracture    AGE 65  . Varicose veins     Past Surgical History: Past Surgical History:  Procedure Laterality Date  . APPENDECTOMY    . CYSTECTOMY Left 1978   left ovary removed  . EYE SURGERY  2008   PUNCTAL MENISCUS CLEANED  . MOHS SURGERY    . OTHER SURGICAL HISTORY  02/2015   EARDRUM PATCH    Social History: Social History   Socioeconomic History  . Marital status: Divorced    Spouse name: Not on file  . Number of children: Not on file  . Years of education: Not on file  . Highest education level: Not on file  Occupational History  . Not on file  Tobacco Use  . Smoking status: Never Smoker  . Smokeless tobacco: Never Used  Substance and Sexual Activity  . Alcohol use: Not Currently    Alcohol/week: 1.0 standard drink    Types: 1 Glasses of wine per week    Comment: last  drink 11/2015  . Drug use: No  . Sexual activity: Not on file  Other Topics Concern  . Not on file  Social History Narrative  . Not on file   Social Determinants of Health   Financial Resource Strain:   . Difficulty of Paying Living Expenses: Not on file  Food Insecurity:   . Worried About Charity fundraiser in the Last Year: Not on file  . Ran Out of Food in the Last Year: Not on file  Transportation Needs:   . Lack of Transportation (Medical): Not on file  . Lack of Transportation (Non-Medical): Not on file  Physical Activity:   . Days of Exercise per Week: Not on file  . Minutes of Exercise per Session: Not on file  Stress:   . Feeling of Stress : Not on file  Social Connections:   . Frequency of Communication with Friends and Family: Not on file  . Frequency of Social Gatherings with Friends and Family: Not on file  . Attends Religious Services: Not on file  . Active Member of Clubs or Organizations: Not on file  . Attends Archivist Meetings: Not on file  . Marital Status: Not on file  Intimate Partner Violence:   . Fear of Current or Ex-Partner: Not on file  . Emotionally Abused: Not on file  . Physically Abused: Not on file  . Sexually Abused: Not on file    Family History: Family History  Problem Relation Age of Onset  . Dementia Mother   . Stroke Father   . Lymphoma Brother     Review of Systems: Review of Systems  Constitutional: Negative for chills and fever.  HENT: Negative for congestion and sore throat.   Respiratory: Negative for cough and shortness of breath.   Cardiovascular: Negative for chest pain.  Gastrointestinal: Negative for abdominal pain, nausea and vomiting.  Skin: Negative for itching and rash.       Mass on right mid-back    Physical Exam: Vital Signs BP 132/80 (BP Location: Left Arm, Patient Position: Sitting, Cuff Size: Normal)   Pulse (!) 54   Temp 98.3 F (36.8 C) (Temporal)   Ht 5' 5.5" (1.664 m)   Wt 165 lb  (74.8 kg)   SpO2 100%   BMI 27.04 kg/m  Physical Exam Constitutional:      General: She is not in acute distress.    Appearance: Normal  appearance. She is normal weight. She is not ill-appearing.  HENT:     Head: Normocephalic and atraumatic.  Eyes:     Extraocular Movements: Extraocular movements intact.  Cardiovascular:     Rate and Rhythm: Normal rate and regular rhythm.     Pulses: Normal pulses.     Heart sounds: Normal heart sounds.  Pulmonary:     Effort: Pulmonary effort is normal.     Breath sounds: Normal breath sounds. No wheezing, rhonchi or rales.  Abdominal:     General: Bowel sounds are normal.     Palpations: Abdomen is soft.  Musculoskeletal:        General: No swelling. Normal range of motion.     Cervical back: Normal range of motion.  Skin:    General: Skin is warm and dry.     Coloration: Skin is not pale.     Findings: No erythema or rash.     Comments: ~ 10 cm freely movable soft mass in right mid-back.   Neurological:     General: No focal deficit present.     Mental Status: She is alert and oriented to person, place, and time.  Psychiatric:        Mood and Affect: Mood normal.        Behavior: Behavior normal.        Thought Content: Thought content normal.        Judgment: Judgment normal.     Assessment/Plan:  Ms. Garrette scheduled for excision of back lipoma (right) with Dr. Claudia Desanctis.  Risks, benefits, and alternatives of procedure discussed, questions answered and consent obtained.    Smoking Status: non-smoker; Counseling Given? N/A  Caprini Score: 6 High; Risk Factors include: 69 year old female, varicose veins, HRT, BMI > 25, and length of planned surgery. Recommendation for mechanical and pharmacological prophylaxis during surgery. Encourage early ambulation.   Post-op Rx sent to pharmacy: none - patient plans to manage on tylenol and ibuprofen. Will call if she needs additional pain medication.  Patient was provided with the General  Surgical Risk consent document and Pain Medication Agreement prior to their appointment.  They had adequate time to read through the risk consent documents and Pain Medication Agreement. We also discussed them in person together during this preop appointment. All of their questions were answered to their satisfaction.  Recommended calling if they have any further questions.  Risk consent form and Pain Medication Agreement to be scanned into patient's chart.  Electronically signed by: Threasa Heads, PA-C 12/25/2019 10:08 AM

## 2019-12-25 ENCOUNTER — Ambulatory Visit (INDEPENDENT_AMBULATORY_CARE_PROVIDER_SITE_OTHER): Payer: Medicare Other | Admitting: Plastic Surgery

## 2019-12-25 ENCOUNTER — Encounter: Payer: Self-pay | Admitting: Plastic Surgery

## 2019-12-25 VITALS — BP 132/80 | HR 54 | Temp 98.3°F | Ht 65.5 in | Wt 165.0 lb

## 2019-12-25 DIAGNOSIS — D171 Benign lipomatous neoplasm of skin and subcutaneous tissue of trunk: Secondary | ICD-10-CM

## 2019-12-27 ENCOUNTER — Other Ambulatory Visit (HOSPITAL_COMMUNITY)
Admission: RE | Admit: 2019-12-27 | Discharge: 2019-12-27 | Disposition: A | Discharge status: Home / Self Care | Payer: Medicare Other | Source: Ambulatory Visit | Attending: Plastic Surgery | Admitting: Plastic Surgery

## 2019-12-27 DIAGNOSIS — Z20822 Contact with and (suspected) exposure to covid-19: Secondary | ICD-10-CM | POA: Insufficient documentation

## 2019-12-27 DIAGNOSIS — Z01812 Encounter for preprocedural laboratory examination: Secondary | ICD-10-CM | POA: Diagnosis not present

## 2019-12-27 LAB — SARS CORONAVIRUS 2 (TAT 6-24 HRS): SARS Coronavirus 2: NEGATIVE

## 2019-12-31 ENCOUNTER — Ambulatory Visit (HOSPITAL_BASED_OUTPATIENT_CLINIC_OR_DEPARTMENT_OTHER)
Admission: RE | Admit: 2019-12-31 | Discharge: 2019-12-31 | Disposition: A | Discharge status: Home / Self Care | Payer: Medicare Other | Attending: Plastic Surgery | Admitting: Plastic Surgery

## 2019-12-31 ENCOUNTER — Other Ambulatory Visit: Payer: Self-pay

## 2019-12-31 ENCOUNTER — Encounter (HOSPITAL_BASED_OUTPATIENT_CLINIC_OR_DEPARTMENT_OTHER)
Admission: RE | Disposition: A | Discharge status: Home / Self Care | Payer: Self-pay | Source: Home / Self Care | Attending: Plastic Surgery

## 2019-12-31 ENCOUNTER — Encounter (HOSPITAL_BASED_OUTPATIENT_CLINIC_OR_DEPARTMENT_OTHER): Payer: Self-pay | Admitting: Plastic Surgery

## 2019-12-31 ENCOUNTER — Ambulatory Visit (HOSPITAL_BASED_OUTPATIENT_CLINIC_OR_DEPARTMENT_OTHER): Payer: Medicare Other | Admitting: Certified Registered Nurse Anesthetist

## 2019-12-31 DIAGNOSIS — M722 Plantar fascial fibromatosis: Secondary | ICD-10-CM | POA: Diagnosis not present

## 2019-12-31 DIAGNOSIS — M1711 Unilateral primary osteoarthritis, right knee: Secondary | ICD-10-CM | POA: Diagnosis not present

## 2019-12-31 DIAGNOSIS — D171 Benign lipomatous neoplasm of skin and subcutaneous tissue of trunk: Secondary | ICD-10-CM | POA: Insufficient documentation

## 2019-12-31 DIAGNOSIS — E039 Hypothyroidism, unspecified: Secondary | ICD-10-CM | POA: Diagnosis not present

## 2019-12-31 HISTORY — PX: LIPOMA EXCISION: SHX5283

## 2019-12-31 SURGERY — EXCISION LIPOMA
Anesthesia: General | Site: Back | Laterality: Right

## 2019-12-31 MED ORDER — LIDOCAINE 2% (20 MG/ML) 5 ML SYRINGE
INTRAMUSCULAR | Status: AC
  Filled 2019-12-31: qty 5

## 2019-12-31 MED ORDER — LACTATED RINGERS IV SOLN: INTRAVENOUS | Status: DC

## 2019-12-31 MED ORDER — FENTANYL CITRATE (PF) 100 MCG/2ML IJ SOLN
INTRAMUSCULAR | Status: DC | PRN
Start: 2019-12-31 — End: 2019-12-31
  Administered 2019-12-31 (×2): 50 ug via INTRAVENOUS

## 2019-12-31 MED ORDER — DEXAMETHASONE SODIUM PHOSPHATE 10 MG/ML IJ SOLN
INTRAMUSCULAR | Status: AC
  Filled 2019-12-31: qty 1

## 2019-12-31 MED ORDER — GLYCOPYRROLATE 0.2 MG/ML IJ SOLN
INTRAMUSCULAR | Status: DC | PRN
  Administered 2019-12-31: .2 mg via INTRAVENOUS

## 2019-12-31 MED ORDER — LIDOCAINE HCL (CARDIAC) PF 100 MG/5ML IV SOSY
PREFILLED_SYRINGE | INTRAVENOUS | Status: DC | PRN
  Administered 2019-12-31: 60 mg via INTRAVENOUS

## 2019-12-31 MED ORDER — PROPOFOL 10 MG/ML IV BOLUS
INTRAVENOUS | Status: DC | PRN
  Administered 2019-12-31: 150 mg via INTRAVENOUS

## 2019-12-31 MED ORDER — FENTANYL CITRATE (PF) 100 MCG/2ML IJ SOLN: 25.0000 ug | INTRAMUSCULAR | Status: DC | PRN

## 2019-12-31 MED ORDER — CEFAZOLIN SODIUM-DEXTROSE 2-4 GM/100ML-% IV SOLN
2.0000 g | INTRAVENOUS | Status: AC
  Administered 2019-12-31: 2 g via INTRAVENOUS

## 2019-12-31 MED ORDER — EPHEDRINE 5 MG/ML INJ
INTRAVENOUS | Status: AC
  Filled 2019-12-31: qty 10

## 2019-12-31 MED ORDER — OXYCODONE HCL 5 MG PO TABS: 5.0000 mg | ORAL_TABLET | Freq: Once | ORAL | Status: DC | PRN

## 2019-12-31 MED ORDER — ONDANSETRON HCL 4 MG/2ML IJ SOLN
INTRAMUSCULAR | Status: AC
  Filled 2019-12-31: qty 2

## 2019-12-31 MED ORDER — ONDANSETRON HCL 4 MG/2ML IJ SOLN
INTRAMUSCULAR | Status: DC | PRN
  Administered 2019-12-31: 4 mg via INTRAVENOUS

## 2019-12-31 MED ORDER — PROPOFOL 10 MG/ML IV BOLUS
INTRAVENOUS | Status: AC
  Filled 2019-12-31: qty 20

## 2019-12-31 MED ORDER — MIDAZOLAM HCL 2 MG/2ML IJ SOLN
INTRAMUSCULAR | Status: AC
  Filled 2019-12-31: qty 2

## 2019-12-31 MED ORDER — DEXAMETHASONE SODIUM PHOSPHATE 10 MG/ML IJ SOLN
INTRAMUSCULAR | Status: DC | PRN
  Administered 2019-12-31: 5 mg via INTRAVENOUS

## 2019-12-31 MED ORDER — LIDOCAINE-EPINEPHRINE 1 %-1:100000 IJ SOLN
INTRAMUSCULAR | Status: DC | PRN
  Administered 2019-12-31: 20 mL

## 2019-12-31 MED ORDER — GLYCOPYRROLATE PF 0.2 MG/ML IJ SOSY
PREFILLED_SYRINGE | INTRAMUSCULAR | Status: AC
  Filled 2019-12-31: qty 1

## 2019-12-31 MED ORDER — CEFAZOLIN SODIUM-DEXTROSE 2-4 GM/100ML-% IV SOLN
INTRAVENOUS | Status: AC
  Filled 2019-12-31: qty 100

## 2019-12-31 MED ORDER — OXYCODONE HCL 5 MG/5ML PO SOLN: 5.0000 mg | Freq: Once | ORAL | Status: DC | PRN

## 2019-12-31 MED ORDER — EPHEDRINE SULFATE 50 MG/ML IJ SOLN
INTRAMUSCULAR | Status: DC | PRN
  Administered 2019-12-31: 10 mg via INTRAVENOUS

## 2019-12-31 MED ORDER — MIDAZOLAM HCL 2 MG/2ML IJ SOLN
INTRAMUSCULAR | Status: DC | PRN
  Administered 2019-12-31 (×2): 1 mg via INTRAVENOUS

## 2019-12-31 MED ORDER — FENTANYL CITRATE (PF) 100 MCG/2ML IJ SOLN
INTRAMUSCULAR | Status: AC
  Filled 2019-12-31: qty 2

## 2019-12-31 MED ORDER — ONDANSETRON HCL 4 MG/2ML IJ SOLN: 4.0000 mg | Freq: Once | INTRAMUSCULAR | Status: DC | PRN

## 2019-12-31 SURGICAL SUPPLY — 72 items
ADH SKN CLS APL DERMABOND .7 (GAUZE/BANDAGES/DRESSINGS)
APL PRP STRL LF DISP 70% ISPRP (MISCELLANEOUS) ×1
APL SKNCLS STERI-STRIP NONHPOA (GAUZE/BANDAGES/DRESSINGS) ×1
BAND INSRT 18 STRL LF DISP RB (MISCELLANEOUS)
BAND RUBBER #18 3X1/16 STRL (MISCELLANEOUS) IMPLANT
BENZOIN TINCTURE PRP APPL 2/3 (GAUZE/BANDAGES/DRESSINGS) ×2 IMPLANT
BLADE CLIPPER SURG (BLADE) IMPLANT
BLADE SURG 15 STRL LF DISP TIS (BLADE) ×1 IMPLANT
BLADE SURG 15 STRL SS (BLADE) ×2
BNDG ELASTIC 6X5.8 VLCR STR LF (GAUZE/BANDAGES/DRESSINGS) ×2 IMPLANT
CANISTER SUCT 1200ML W/VALVE (MISCELLANEOUS) IMPLANT
CHLORAPREP W/TINT 26 (MISCELLANEOUS) ×2 IMPLANT
COVER BACK TABLE 60X90IN (DRAPES) ×2 IMPLANT
COVER MAYO STAND STRL (DRAPES) ×2 IMPLANT
COVER WAND RF STERILE (DRAPES) IMPLANT
DERMABOND ADVANCED (GAUZE/BANDAGES/DRESSINGS)
DERMABOND ADVANCED .7 DNX12 (GAUZE/BANDAGES/DRESSINGS) IMPLANT
DRAIN JP 10F RND SILICONE (MISCELLANEOUS) IMPLANT
DRAPE LAPAROTOMY 100X72 PEDS (DRAPES) ×2 IMPLANT
DRAPE U-SHAPE 76X120 STRL (DRAPES) IMPLANT
DRAPE UTILITY XL STRL (DRAPES) ×2 IMPLANT
DRSG MEPILEX BORDER 4X8 (GAUZE/BANDAGES/DRESSINGS) ×2 IMPLANT
DRSG TELFA 3X8 NADH (GAUZE/BANDAGES/DRESSINGS) IMPLANT
ELECT COATED BLADE 2.86 ST (ELECTRODE) ×2 IMPLANT
ELECT NEEDLE BLADE 2-5/6 (NEEDLE) IMPLANT
ELECT REM PT RETURN 9FT ADLT (ELECTROSURGICAL) ×2
ELECT REM PT RETURN 9FT PED (ELECTROSURGICAL)
ELECTRODE REM PT RETRN 9FT PED (ELECTROSURGICAL) IMPLANT
ELECTRODE REM PT RTRN 9FT ADLT (ELECTROSURGICAL) ×1 IMPLANT
EVACUATOR SILICONE 100CC (DRAIN) IMPLANT
GAUZE SPONGE 4X4 12PLY STRL LF (GAUZE/BANDAGES/DRESSINGS) IMPLANT
GAUZE XEROFORM 1X8 LF (GAUZE/BANDAGES/DRESSINGS) IMPLANT
GLOVE BIO SURGEON STRL SZ 6.5 (GLOVE) ×2 IMPLANT
GLOVE BIOGEL M 6.5 STRL (GLOVE) ×2 IMPLANT
GLOVE BIOGEL M STRL SZ7.5 (GLOVE) ×2 IMPLANT
GLOVE BIOGEL PI IND STRL 6.5 (GLOVE) ×1 IMPLANT
GLOVE BIOGEL PI IND STRL 8 (GLOVE) ×1 IMPLANT
GLOVE BIOGEL PI INDICATOR 6.5 (GLOVE) ×1
GLOVE BIOGEL PI INDICATOR 8 (GLOVE) ×1
GOWN STRL REUS W/ TWL LRG LVL3 (GOWN DISPOSABLE) ×3 IMPLANT
GOWN STRL REUS W/TWL LRG LVL3 (GOWN DISPOSABLE) ×6
NEEDLE HYPO 30GX1 BEV (NEEDLE) IMPLANT
NEEDLE PRECISIONGLIDE 27X1.5 (NEEDLE) ×2 IMPLANT
NEEDLE SPNL 18GX3.5 QUINCKE PK (NEEDLE) IMPLANT
NS IRRIG 1000ML POUR BTL (IV SOLUTION) ×2 IMPLANT
PACK BASIN DAY SURGERY FS (CUSTOM PROCEDURE TRAY) ×2 IMPLANT
PENCIL SMOKE EVACUATOR (MISCELLANEOUS) ×2 IMPLANT
SHEET MEDIUM DRAPE 40X70 STRL (DRAPES) IMPLANT
SLEEVE SCD COMPRESS KNEE MED (MISCELLANEOUS) ×2 IMPLANT
SPONGE GAUZE 2X2 8PLY STRL LF (GAUZE/BANDAGES/DRESSINGS) IMPLANT
SPONGE LAP 18X18 RF (DISPOSABLE) IMPLANT
STAPLER VISISTAT 35W (STAPLE) IMPLANT
STRIP CLOSURE SKIN 1/2X4 (GAUZE/BANDAGES/DRESSINGS) ×2 IMPLANT
SUCTION FRAZIER HANDLE 10FR (MISCELLANEOUS)
SUCTION TUBE FRAZIER 10FR DISP (MISCELLANEOUS) IMPLANT
SUT ETHILON 4 0 PS 2 18 (SUTURE) IMPLANT
SUT MNCRL AB 4-0 PS2 18 (SUTURE) IMPLANT
SUT MON AB 5-0 P3 18 (SUTURE) IMPLANT
SUT PDS 3-0 CT2 (SUTURE) ×2
SUT PDS II 3-0 CT2 27 ABS (SUTURE) ×1 IMPLANT
SUT PLAIN 5 0 P 3 18 (SUTURE) IMPLANT
SUT PROLENE 5 0 P 3 (SUTURE) IMPLANT
SUT VICRYL 4-0 PS2 18IN ABS (SUTURE) IMPLANT
SUT VLOC 90 P-14 23 (SUTURE) ×2 IMPLANT
SWAB COLLECTION DEVICE MRSA (MISCELLANEOUS) IMPLANT
SWAB CULTURE ESWAB REG 1ML (MISCELLANEOUS) IMPLANT
SYR BULB EAR ULCER 3OZ GRN STR (SYRINGE) IMPLANT
SYR CONTROL 10ML LL (SYRINGE) ×2 IMPLANT
TOWEL GREEN STERILE FF (TOWEL DISPOSABLE) ×2 IMPLANT
TRAY DSU PREP LF (CUSTOM PROCEDURE TRAY) IMPLANT
TUBE CONNECTING 20X1/4 (TUBING) IMPLANT
TUBING INFILTRATION IT-10001 (TUBING) IMPLANT

## 2019-12-31 NOTE — Discharge Instructions (Signed)
Activity As tolerated: NO showers for 3 days No heavy activities  Diet: Regular  Wound Care: Keep dressing clean & dry for 3 days.  Keep wrap applied with compression as much as possible.    Do not change dressings for 3 days unless soiled.  Can change if needed but make sure to reapply wrap. After three days can remove wrap and shower.  Then reapply dressings if needed and continue compression with wrap until follow up.  Call doctor if any unusual problems occur such as pain, excessive bleeding, unrelieved nausea/vomiting, fever &/or chills  Follow-up appointment: Scheduled for 11/3.    Post Anesthesia Home Care Instructions  Activity: Get plenty of rest for the remainder of the day. A responsible individual must stay with you for 24 hours following the procedure.  For the next 24 hours, DO NOT: -Drive a car -Paediatric nurse -Drink alcoholic beverages -Take any medication unless instructed by your physician -Make any legal decisions or sign important papers.  Meals: Start with liquid foods such as gelatin or soup. Progress to regular foods as tolerated. Avoid greasy, spicy, heavy foods. If nausea and/or vomiting occur, drink only clear liquids until the nausea and/or vomiting subsides. Call your physician if vomiting continues.  Special Instructions/Symptoms: Your throat may feel dry or sore from the anesthesia or the breathing tube placed in your throat during surgery. If this causes discomfort, gargle with warm salt water. The discomfort should disappear within 24 hours.  If you had a scopolamine patch placed behind your ear for the management of post- operative nausea and/or vomiting:  1. The medication in the patch is effective for 72 hours, after which it should be removed.  Wrap patch in a tissue and discard in the trash. Wash hands thoroughly with soap and water. 2. You may remove the patch earlier than 72 hours if you experience unpleasant side effects which may  include dry mouth, dizziness or visual disturbances. 3. Avoid touching the patch. Wash your hands with soap and water after contact with the patch.

## 2019-12-31 NOTE — Anesthesia Procedure Notes (Signed)
Procedure Name: LMA Insertion Date/Time: 12/31/2019 9:23 AM Performed by: Collier Bullock, CRNA Pre-anesthesia Checklist: Patient identified, Emergency Drugs available, Suction available and Patient being monitored Patient Re-evaluated:Patient Re-evaluated prior to induction Oxygen Delivery Method: Circle system utilized Preoxygenation: Pre-oxygenation with 100% oxygen Induction Type: IV induction Ventilation: Mask ventilation without difficulty LMA: LMA inserted LMA Size: 4.0 Number of attempts: 1 Placement Confirmation: positive ETCO2 and breath sounds checked- equal and bilateral Tube secured with: Tape Dental Injury: Teeth and Oropharynx as per pre-operative assessment

## 2019-12-31 NOTE — Op Note (Signed)
Operative Note   DATE OF OPERATION: 12/31/2019  SURGICAL DEPARTMENT: Plastic Surgery  PREOPERATIVE DIAGNOSES:  Right back lipoma  POSTOPERATIVE DIAGNOSES:  same  PROCEDURE:  1. Excision of subfascial right back lipoma 10cm 2. Complex closure right back 10 cm  SURGEON: Talmadge Coventry, MD  ASSISTANT: Letta Median, PA The advanced practice practitioner (APP) assisted throughout the case.  The APP was essential in retraction and counter traction when needed to make the case progress smoothly.  This retraction and assistance made it possible to see the tissue plans for the procedure.  The assistance was needed for blood control, tissue re-approximation and assisted with closure of the incision site.  ANESTHESIA:  General.   COMPLICATIONS: None.   INDICATIONS FOR PROCEDURE:  The patient, Sarah Buchanan is a 69 y.o. female born on 08-Aug-1950, is here for treatment of symptomatic right back lipoma MRN: [id number]  CONSENT:  Informed consent was obtained directly from the patient. Risks, benefits and alternatives were fully discussed. Specific risks including but not limited to bleeding, infection, hematoma, seroma, scarring, pain, contracture, asymmetry, wound healing problems, and need for further surgery were all discussed. The patient did have an ample opportunity to have questions answered to satisfaction.   DESCRIPTION OF PROCEDURE:  The patient was taken to the operating room. SCDs were placed and Ancef antibiotics were given. General anesthesia was administered.  The patient's operative site was prepped and draped in a sterile fashion. A time out was performed and all information was confirmed to be correct.  Local anesthetic was infiltrated.  An elliptical incision was made.  Cautery was used to dissect down to the mass which was subfascial.  Blunt and cautery dissection were used to excise the mass en bloc.  Hemostasis was obtained.  The skin was undermined, advanced, and closed  in layers with buried 3-0 PDS sutures and running V-lock.  Wound covered with steri strips and compressive dressing.  The mass was 10cm in diameter and the wound was 10cm in length.  The patient tolerated the procedure well.  There were no complications. The patient was allowed to wake from anesthesia, extubated and taken to the recovery room in satisfactory condition.

## 2019-12-31 NOTE — Anesthesia Preprocedure Evaluation (Addendum)
Anesthesia Evaluation  Patient identified by MRN, date of birth, ID band Patient awake    Reviewed: Allergy & Precautions, NPO status , Patient's Chart, lab work & pertinent test results  History of Anesthesia Complications Negative for: history of anesthetic complications  Airway Mallampati: I  TM Distance: >3 FB Neck ROM: Full    Dental  (+) Dental Advisory Given   Pulmonary neg pulmonary ROS,    Pulmonary exam normal        Cardiovascular negative cardio ROS Normal cardiovascular exam     Neuro/Psych  Hx transient global amnesia  negative psych ROS   GI/Hepatic negative GI ROS, Neg liver ROS,   Endo/Other  Hypothyroidism   Renal/GU negative Renal ROS     Musculoskeletal  (+) Arthritis ,   Abdominal   Peds  Hematology negative hematology ROS (+)   Anesthesia Other Findings Covid test negative   Reproductive/Obstetrics                            Anesthesia Physical Anesthesia Plan  ASA: II  Anesthesia Plan: General   Post-op Pain Management:    Induction: Intravenous  PONV Risk Score and Plan: 3 and Treatment may vary due to age or medical condition, Ondansetron and Dexamethasone  Airway Management Planned: LMA  Additional Equipment: None  Intra-op Plan:   Post-operative Plan: Extubation in OR  Informed Consent: I have reviewed the patients History and Physical, chart, labs and discussed the procedure including the risks, benefits and alternatives for the proposed anesthesia with the patient or authorized representative who has indicated his/her understanding and acceptance.     Dental advisory given  Plan Discussed with: CRNA and Anesthesiologist  Anesthesia Plan Comments: (LMA if lateral, ETT if prone )       Anesthesia Quick Evaluation

## 2019-12-31 NOTE — Interval H&P Note (Signed)
History and Physical Interval Note:  12/31/2019 8:10 AM  Sarah Buchanan  has presented today for surgery, with the diagnosis of lipoma of back.  The various methods of treatment have been discussed with the patient and family. After consideration of risks, benefits and other options for treatment, the patient has consented to  Procedure(s) with comments: Excision lipoma right back (Right) - 35 min as a surgical intervention.  The patient's history has been reviewed, patient examined, no change in status, stable for surgery.  I have reviewed the patient's chart and labs.  Questions were answered to the patient's satisfaction.     [name]

## 2019-12-31 NOTE — Transfer of Care (Signed)
Immediate Anesthesia Transfer of Care Note  Patient: Sarah Buchanan  Procedure(s) Performed: Excision lipoma right back (Right Back)  Patient Location: PACU  Anesthesia Type:General  Level of Consciousness: awake, alert  and patient cooperative  Airway & Oxygen Therapy: Patient Spontanous Breathing and Patient connected to face mask oxygen  Post-op Assessment: Report given to RN, Post -op Vital signs reviewed and stable, Patient moving all extremities X 4 and Patient able to stick tongue midline  Post vital signs: Reviewed and stable  Last Vitals:  Vitals Value Taken Time  BP 134/76 12/31/19 1008  Temp    Pulse 92 12/31/19 1010  Resp 22 12/31/19 1010  SpO2 100 % 12/31/19 1010    Last Pain:  Vitals:   12/31/19 0819  TempSrc: Oral  PainSc: 0-No pain         Complications: No complications documented.

## 2019-12-31 NOTE — Anesthesia Postprocedure Evaluation (Signed)
Anesthesia Post Note  Patient: Sarah Buchanan  Procedure(s) Performed: Excision lipoma right back (Right Back)     Patient location during evaluation: PACU Anesthesia Type: General Level of consciousness: awake and alert Pain management: pain level controlled Vital Signs Assessment: post-procedure vital signs reviewed and stable Respiratory status: spontaneous breathing, nonlabored ventilation and respiratory function stable Cardiovascular status: blood pressure returned to baseline and stable Postop Assessment: no apparent nausea or vomiting Anesthetic complications: no   No complications documented.  Last Vitals:  Vitals:   12/31/19 1033 12/31/19 1040  BP:  116/68  Pulse: 80 83  Resp: 10 18  Temp:  (!) 36.2 C  SpO2: 95% 95%    Last Pain:  Vitals:   12/31/19 1040  TempSrc:   PainSc: 0-No pain                 Audry Pili

## 2019-12-31 NOTE — Brief Op Note (Signed)
12/31/2019  9:59 AM  PATIENT:  Jacalyn Lefevre  69 y.o. female  PRE-OPERATIVE DIAGNOSIS:  lipoma of back  POST-OPERATIVE DIAGNOSIS:  lipoma of back  PROCEDURE:  Procedure(s) with comments: Excision lipoma right back (Right) - 35 min  SURGEON:  Surgeon(s) and Role:    * Kester Stimpson, Steffanie Dunn, MD - Primary  PHYSICIAN ASSISTANT: Phoebe Sharps, PA  ASSISTANTS: none   ANESTHESIA:   general  EBL:  5 mL   BLOOD ADMINISTERED:none  DRAINS: none   LOCAL MEDICATIONS USED:  XYLOCAINE   SPECIMEN:  Source of Specimen:  right back lipoma  DISPOSITION OF SPECIMEN:  PATHOLOGY  COUNTS:  YES  TOURNIQUET:  * No tourniquets in log *  DICTATION: .Dragon Dictation  PLAN OF CARE: Discharge to home after PACU  PATIENT DISPOSITION:  PACU - hemodynamically stable.   Delay start of Pharmacological VTE agent (>24hrs) due to surgical blood loss or risk of bleeding: not applicable

## 2020-01-01 ENCOUNTER — Encounter (HOSPITAL_BASED_OUTPATIENT_CLINIC_OR_DEPARTMENT_OTHER): Payer: Self-pay | Admitting: Plastic Surgery

## 2020-01-01 LAB — SURGICAL PATHOLOGY

## 2020-01-08 ENCOUNTER — Encounter: Payer: Self-pay | Admitting: Plastic Surgery

## 2020-01-08 ENCOUNTER — Other Ambulatory Visit: Payer: Self-pay

## 2020-01-08 ENCOUNTER — Ambulatory Visit (INDEPENDENT_AMBULATORY_CARE_PROVIDER_SITE_OTHER): Payer: Medicare Other | Admitting: Plastic Surgery

## 2020-01-08 VITALS — BP 114/68 | HR 85

## 2020-01-08 DIAGNOSIS — D171 Benign lipomatous neoplasm of skin and subcutaneous tissue of trunk: Secondary | ICD-10-CM

## 2020-01-08 NOTE — Progress Notes (Signed)
Patient presents about 1 week postop from excision of a right back lipoma.  She is doing well with no complaints.  On exam everything looks fine with the incision healing appropriately no subcutaneous swelling.  We discussed further management postoperatively and I think she will go on to do well.  We discussed follow-up and she prefers to follow-up on an as-needed basis and I think that is appropriate at this point.  All of her questions were answered.

## 2020-05-05 DIAGNOSIS — L821 Other seborrheic keratosis: Secondary | ICD-10-CM | POA: Diagnosis not present

## 2020-05-05 DIAGNOSIS — D1801 Hemangioma of skin and subcutaneous tissue: Secondary | ICD-10-CM | POA: Diagnosis not present

## 2020-05-05 DIAGNOSIS — L438 Other lichen planus: Secondary | ICD-10-CM | POA: Diagnosis not present

## 2020-05-05 DIAGNOSIS — L814 Other melanin hyperpigmentation: Secondary | ICD-10-CM | POA: Diagnosis not present

## 2020-05-05 DIAGNOSIS — D225 Melanocytic nevi of trunk: Secondary | ICD-10-CM | POA: Diagnosis not present

## 2020-05-05 DIAGNOSIS — L570 Actinic keratosis: Secondary | ICD-10-CM | POA: Diagnosis not present

## 2020-06-01 ENCOUNTER — Other Ambulatory Visit: Payer: Medicare Other

## 2020-07-13 DIAGNOSIS — R32 Unspecified urinary incontinence: Secondary | ICD-10-CM | POA: Diagnosis not present

## 2020-07-13 DIAGNOSIS — N819 Female genital prolapse, unspecified: Secondary | ICD-10-CM | POA: Diagnosis not present

## 2020-08-25 ENCOUNTER — Other Ambulatory Visit: Payer: Self-pay | Admitting: Internal Medicine

## 2020-08-25 DIAGNOSIS — Z1231 Encounter for screening mammogram for malignant neoplasm of breast: Secondary | ICD-10-CM

## 2020-09-17 ENCOUNTER — Ambulatory Visit
Admission: RE | Admit: 2020-09-17 | Discharge: 2020-09-17 | Disposition: A | Discharge status: Home / Self Care | Payer: Medicare Other | Source: Ambulatory Visit

## 2020-09-17 ENCOUNTER — Other Ambulatory Visit: Payer: Self-pay

## 2020-09-17 DIAGNOSIS — Z1231 Encounter for screening mammogram for malignant neoplasm of breast: Secondary | ICD-10-CM

## 2020-09-28 DIAGNOSIS — E039 Hypothyroidism, unspecified: Secondary | ICD-10-CM | POA: Diagnosis not present

## 2020-09-28 DIAGNOSIS — E785 Hyperlipidemia, unspecified: Secondary | ICD-10-CM | POA: Diagnosis not present

## 2020-09-28 DIAGNOSIS — M8580 Other specified disorders of bone density and structure, unspecified site: Secondary | ICD-10-CM | POA: Diagnosis not present

## 2020-10-02 DIAGNOSIS — E785 Hyperlipidemia, unspecified: Secondary | ICD-10-CM | POA: Diagnosis not present

## 2020-10-02 DIAGNOSIS — E039 Hypothyroidism, unspecified: Secondary | ICD-10-CM | POA: Diagnosis not present

## 2020-10-02 DIAGNOSIS — M810 Age-related osteoporosis without current pathological fracture: Secondary | ICD-10-CM | POA: Diagnosis not present

## 2020-10-13 DIAGNOSIS — H25813 Combined forms of age-related cataract, bilateral: Secondary | ICD-10-CM | POA: Diagnosis not present

## 2020-10-13 DIAGNOSIS — H04123 Dry eye syndrome of bilateral lacrimal glands: Secondary | ICD-10-CM | POA: Diagnosis not present

## 2020-10-13 DIAGNOSIS — H35373 Puckering of macula, bilateral: Secondary | ICD-10-CM | POA: Diagnosis not present

## 2020-10-19 DIAGNOSIS — Z6829 Body mass index (BMI) 29.0-29.9, adult: Secondary | ICD-10-CM | POA: Diagnosis not present

## 2020-10-19 DIAGNOSIS — Z124 Encounter for screening for malignant neoplasm of cervix: Secondary | ICD-10-CM | POA: Diagnosis not present

## 2020-11-04 DIAGNOSIS — E039 Hypothyroidism, unspecified: Secondary | ICD-10-CM | POA: Diagnosis not present

## 2020-11-04 DIAGNOSIS — M859 Disorder of bone density and structure, unspecified: Secondary | ICD-10-CM | POA: Diagnosis not present

## 2020-11-04 DIAGNOSIS — E785 Hyperlipidemia, unspecified: Secondary | ICD-10-CM | POA: Diagnosis not present

## 2020-11-10 DIAGNOSIS — H35341 Macular cyst, hole, or pseudohole, right eye: Secondary | ICD-10-CM | POA: Diagnosis not present

## 2020-11-10 DIAGNOSIS — H31093 Other chorioretinal scars, bilateral: Secondary | ICD-10-CM | POA: Diagnosis not present

## 2020-11-10 DIAGNOSIS — H35373 Puckering of macula, bilateral: Secondary | ICD-10-CM | POA: Diagnosis not present

## 2020-11-10 DIAGNOSIS — H2513 Age-related nuclear cataract, bilateral: Secondary | ICD-10-CM | POA: Diagnosis not present

## 2020-11-11 DIAGNOSIS — M722 Plantar fascial fibromatosis: Secondary | ICD-10-CM | POA: Diagnosis not present

## 2020-11-11 DIAGNOSIS — Z1212 Encounter for screening for malignant neoplasm of rectum: Secondary | ICD-10-CM | POA: Diagnosis not present

## 2020-11-11 DIAGNOSIS — M545 Low back pain, unspecified: Secondary | ICD-10-CM | POA: Diagnosis not present

## 2020-11-11 DIAGNOSIS — R82998 Other abnormal findings in urine: Secondary | ICD-10-CM | POA: Diagnosis not present

## 2020-11-11 DIAGNOSIS — E039 Hypothyroidism, unspecified: Secondary | ICD-10-CM | POA: Diagnosis not present

## 2020-11-11 DIAGNOSIS — E785 Hyperlipidemia, unspecified: Secondary | ICD-10-CM | POA: Diagnosis not present

## 2020-11-11 DIAGNOSIS — H04129 Dry eye syndrome of unspecified lacrimal gland: Secondary | ICD-10-CM | POA: Diagnosis not present

## 2020-11-11 DIAGNOSIS — Z780 Asymptomatic menopausal state: Secondary | ICD-10-CM | POA: Diagnosis not present

## 2020-11-11 DIAGNOSIS — M8580 Other specified disorders of bone density and structure, unspecified site: Secondary | ICD-10-CM | POA: Diagnosis not present

## 2020-11-11 DIAGNOSIS — N3946 Mixed incontinence: Secondary | ICD-10-CM | POA: Diagnosis not present

## 2020-11-11 DIAGNOSIS — Z0000 Encounter for general adult medical examination without abnormal findings: Secondary | ICD-10-CM | POA: Diagnosis not present

## 2020-11-11 DIAGNOSIS — Z23 Encounter for immunization: Secondary | ICD-10-CM | POA: Diagnosis not present

## 2020-11-11 DIAGNOSIS — Z1331 Encounter for screening for depression: Secondary | ICD-10-CM | POA: Diagnosis not present

## 2020-11-13 ENCOUNTER — Encounter (INDEPENDENT_AMBULATORY_CARE_PROVIDER_SITE_OTHER): Payer: Self-pay | Admitting: Ophthalmology

## 2020-11-17 ENCOUNTER — Encounter (INDEPENDENT_AMBULATORY_CARE_PROVIDER_SITE_OTHER): Payer: Medicare Other | Admitting: Ophthalmology

## 2020-11-24 ENCOUNTER — Encounter (INDEPENDENT_AMBULATORY_CARE_PROVIDER_SITE_OTHER): Payer: Medicare Other | Admitting: Ophthalmology

## 2020-12-07 DIAGNOSIS — H8111 Benign paroxysmal vertigo, right ear: Secondary | ICD-10-CM | POA: Diagnosis not present

## 2020-12-07 DIAGNOSIS — M545 Low back pain, unspecified: Secondary | ICD-10-CM | POA: Diagnosis not present

## 2020-12-07 DIAGNOSIS — M25511 Pain in right shoulder: Secondary | ICD-10-CM | POA: Diagnosis not present

## 2020-12-08 DIAGNOSIS — H33333 Multiple defects of retina without detachment, bilateral: Secondary | ICD-10-CM | POA: Diagnosis not present

## 2020-12-08 DIAGNOSIS — H35373 Puckering of macula, bilateral: Secondary | ICD-10-CM | POA: Diagnosis not present

## 2020-12-08 DIAGNOSIS — H04123 Dry eye syndrome of bilateral lacrimal glands: Secondary | ICD-10-CM | POA: Diagnosis not present

## 2020-12-14 DIAGNOSIS — H8111 Benign paroxysmal vertigo, right ear: Secondary | ICD-10-CM | POA: Diagnosis not present

## 2020-12-14 DIAGNOSIS — M545 Low back pain, unspecified: Secondary | ICD-10-CM | POA: Diagnosis not present

## 2020-12-14 DIAGNOSIS — M25511 Pain in right shoulder: Secondary | ICD-10-CM | POA: Diagnosis not present

## 2020-12-21 DIAGNOSIS — H52221 Regular astigmatism, right eye: Secondary | ICD-10-CM | POA: Diagnosis not present

## 2020-12-21 DIAGNOSIS — H25811 Combined forms of age-related cataract, right eye: Secondary | ICD-10-CM | POA: Diagnosis not present

## 2021-01-11 DIAGNOSIS — H25812 Combined forms of age-related cataract, left eye: Secondary | ICD-10-CM | POA: Diagnosis not present

## 2021-01-11 DIAGNOSIS — H52222 Regular astigmatism, left eye: Secondary | ICD-10-CM | POA: Diagnosis not present

## 2021-01-18 DIAGNOSIS — N3946 Mixed incontinence: Secondary | ICD-10-CM | POA: Diagnosis not present

## 2021-01-20 DIAGNOSIS — H59022 Cataract (lens) fragments in eye following cataract surgery, left eye: Secondary | ICD-10-CM | POA: Diagnosis not present

## 2021-01-21 DIAGNOSIS — N3946 Mixed incontinence: Secondary | ICD-10-CM | POA: Diagnosis not present

## 2021-01-21 DIAGNOSIS — N813 Complete uterovaginal prolapse: Secondary | ICD-10-CM | POA: Diagnosis not present

## 2021-02-01 DIAGNOSIS — N813 Complete uterovaginal prolapse: Secondary | ICD-10-CM | POA: Diagnosis not present

## 2021-02-01 DIAGNOSIS — N841 Polyp of cervix uteri: Secondary | ICD-10-CM | POA: Diagnosis not present

## 2021-02-01 DIAGNOSIS — N3946 Mixed incontinence: Secondary | ICD-10-CM | POA: Diagnosis not present

## 2021-02-01 DIAGNOSIS — N888 Other specified noninflammatory disorders of cervix uteri: Secondary | ICD-10-CM | POA: Diagnosis not present

## 2021-02-01 DIAGNOSIS — N710 Acute inflammatory disease of uterus: Secondary | ICD-10-CM | POA: Diagnosis not present

## 2021-02-01 DIAGNOSIS — N736 Female pelvic peritoneal adhesions (postinfective): Secondary | ICD-10-CM | POA: Diagnosis not present

## 2021-02-01 DIAGNOSIS — D251 Intramural leiomyoma of uterus: Secondary | ICD-10-CM | POA: Diagnosis not present

## 2021-03-09 DIAGNOSIS — Z9889 Other specified postprocedural states: Secondary | ICD-10-CM | POA: Diagnosis not present

## 2021-03-09 DIAGNOSIS — H40052 Ocular hypertension, left eye: Secondary | ICD-10-CM | POA: Diagnosis not present

## 2021-03-09 DIAGNOSIS — H59022 Cataract (lens) fragments in eye following cataract surgery, left eye: Secondary | ICD-10-CM | POA: Diagnosis not present

## 2021-03-09 DIAGNOSIS — Z961 Presence of intraocular lens: Secondary | ICD-10-CM | POA: Diagnosis not present

## 2021-03-09 DIAGNOSIS — H04123 Dry eye syndrome of bilateral lacrimal glands: Secondary | ICD-10-CM | POA: Diagnosis not present

## 2021-03-11 ENCOUNTER — Other Ambulatory Visit: Payer: Self-pay | Admitting: Internal Medicine

## 2021-03-11 DIAGNOSIS — M810 Age-related osteoporosis without current pathological fracture: Secondary | ICD-10-CM

## 2021-03-23 DIAGNOSIS — H04123 Dry eye syndrome of bilateral lacrimal glands: Secondary | ICD-10-CM | POA: Diagnosis not present

## 2021-03-23 DIAGNOSIS — H59022 Cataract (lens) fragments in eye following cataract surgery, left eye: Secondary | ICD-10-CM | POA: Diagnosis not present

## 2021-03-23 DIAGNOSIS — H40052 Ocular hypertension, left eye: Secondary | ICD-10-CM | POA: Diagnosis not present

## 2021-03-23 DIAGNOSIS — Z9889 Other specified postprocedural states: Secondary | ICD-10-CM | POA: Diagnosis not present

## 2021-03-23 DIAGNOSIS — Z961 Presence of intraocular lens: Secondary | ICD-10-CM | POA: Diagnosis not present

## 2021-04-02 ENCOUNTER — Ambulatory Visit
Admission: RE | Admit: 2021-04-02 | Discharge: 2021-04-02 | Disposition: A | Discharge status: Home / Self Care | Payer: Medicare Other | Source: Ambulatory Visit | Attending: Internal Medicine | Admitting: Internal Medicine

## 2021-04-02 DIAGNOSIS — M810 Age-related osteoporosis without current pathological fracture: Secondary | ICD-10-CM

## 2021-04-02 DIAGNOSIS — Z780 Asymptomatic menopausal state: Secondary | ICD-10-CM | POA: Diagnosis not present

## 2021-04-02 DIAGNOSIS — M85852 Other specified disorders of bone density and structure, left thigh: Secondary | ICD-10-CM | POA: Diagnosis not present

## 2021-04-20 DIAGNOSIS — H04123 Dry eye syndrome of bilateral lacrimal glands: Secondary | ICD-10-CM | POA: Diagnosis not present

## 2021-04-20 DIAGNOSIS — H35373 Puckering of macula, bilateral: Secondary | ICD-10-CM | POA: Diagnosis not present

## 2021-04-20 DIAGNOSIS — H35341 Macular cyst, hole, or pseudohole, right eye: Secondary | ICD-10-CM | POA: Diagnosis not present

## 2021-04-20 DIAGNOSIS — H26493 Other secondary cataract, bilateral: Secondary | ICD-10-CM | POA: Diagnosis not present

## 2021-05-04 DIAGNOSIS — U071 COVID-19: Secondary | ICD-10-CM | POA: Diagnosis not present

## 2021-05-04 DIAGNOSIS — H35373 Puckering of macula, bilateral: Secondary | ICD-10-CM | POA: Diagnosis not present

## 2021-05-07 DIAGNOSIS — L570 Actinic keratosis: Secondary | ICD-10-CM | POA: Diagnosis not present

## 2021-05-07 DIAGNOSIS — D1801 Hemangioma of skin and subcutaneous tissue: Secondary | ICD-10-CM | POA: Diagnosis not present

## 2021-05-07 DIAGNOSIS — M810 Age-related osteoporosis without current pathological fracture: Secondary | ICD-10-CM | POA: Diagnosis not present

## 2021-05-07 DIAGNOSIS — L720 Epidermal cyst: Secondary | ICD-10-CM | POA: Diagnosis not present

## 2021-05-07 DIAGNOSIS — L821 Other seborrheic keratosis: Secondary | ICD-10-CM | POA: Diagnosis not present

## 2021-05-07 DIAGNOSIS — E785 Hyperlipidemia, unspecified: Secondary | ICD-10-CM | POA: Diagnosis not present

## 2021-05-07 DIAGNOSIS — L814 Other melanin hyperpigmentation: Secondary | ICD-10-CM | POA: Diagnosis not present

## 2021-05-07 DIAGNOSIS — D225 Melanocytic nevi of trunk: Secondary | ICD-10-CM | POA: Diagnosis not present

## 2021-05-07 DIAGNOSIS — U071 COVID-19: Secondary | ICD-10-CM | POA: Diagnosis not present

## 2021-05-07 DIAGNOSIS — E039 Hypothyroidism, unspecified: Secondary | ICD-10-CM | POA: Diagnosis not present

## 2021-05-11 DIAGNOSIS — E039 Hypothyroidism, unspecified: Secondary | ICD-10-CM | POA: Diagnosis not present

## 2021-05-11 DIAGNOSIS — M810 Age-related osteoporosis without current pathological fracture: Secondary | ICD-10-CM | POA: Diagnosis not present

## 2021-05-11 DIAGNOSIS — Z79890 Hormone replacement therapy (postmenopausal): Secondary | ICD-10-CM | POA: Diagnosis not present

## 2021-05-12 ENCOUNTER — Ambulatory Visit (INDEPENDENT_AMBULATORY_CARE_PROVIDER_SITE_OTHER): Payer: Medicare Other

## 2021-05-12 ENCOUNTER — Encounter: Payer: Self-pay | Admitting: Internal Medicine

## 2021-05-12 ENCOUNTER — Other Ambulatory Visit: Payer: Self-pay

## 2021-05-12 ENCOUNTER — Ambulatory Visit (INDEPENDENT_AMBULATORY_CARE_PROVIDER_SITE_OTHER): Payer: Medicare Other | Admitting: Internal Medicine

## 2021-05-12 VITALS — BP 132/78 | HR 62 | Ht 65.0 in | Wt 178.0 lb

## 2021-05-12 DIAGNOSIS — R0602 Shortness of breath: Secondary | ICD-10-CM | POA: Diagnosis not present

## 2021-05-12 DIAGNOSIS — R002 Palpitations: Secondary | ICD-10-CM

## 2021-05-12 NOTE — Progress Notes (Unsigned)
Enrolled for [employer] to mail a ZIO XT long term holter monitor to the patients address on file.  

## 2021-05-12 NOTE — Progress Notes (Signed)
? ? ? ? ?ELECTROPHYSIOLOGY CONSULT NOTE  ?Patient ID: Sarah Buchanan, MRN: [id number], DOB/AGE: 70-02-1951 70 y.o. ?Admit date: (Not on file) ?Date of Consult: 05/12/2021 ? ?Primary Physician: Crist Infante, MD ?Primary Cardiologist:   ?  ?  ?Sarah Buchanan is a 71 y.o. female who is being seen today for the evaluation of palpitations at the request of Dr MP.  ? ? 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   ? ?She developed COVID 12/22 and has had a series of residual symptoms including dyspnea on exertion as well as dyspnea with bending.  This has been gradually improving over the last few weeks.  Quite predictable, not associated with chest discomfort.  There has been no edema.  No nocturnal dyspnea orthopnea. ? ?She has 2 palpitation syndromes.  The first she describes as "pounding heart," abrupt onset and offset lasting 2 to 3 minutes associated with chest discomfort and some lightheadedness.  She wonders repeatedly whether it is anxiety.  ?The second she describes as "racing "it is irregular last 10 to 15 seconds.  Heart rates have been as fast as 125 by palpation.  Intermittent and improving. ? ?She is also had some lightheadedness with standing which has gradually been improving but she also has some transient visual loss which persists.  These episodes are now is brief is 1 to 2 seconds.  She wonders whether she has long COVID. ? ?Her blood pressure machine has noted irregularities on multiple occasions.  It does not record rhythm.  Her blood pressures have also been recorded as elevated. ? ?Her recent non-COVID history includes hysterectomy and right 6 oophorectomy (the whole lot more) she has also had multiple eye surgeries with 14 appointments in [place] since October. ? ?Lab test shows that she is mildly iatrogenic hyperthyroid.  Wonders whether she has post-COVID thyroiditis. ? ? ?  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 ? ?DATE TEST EF   ?2/17 Echo   60-65 %   ?12/19 Ca Score    % O   ?     ? ?Date Cr K Hgb  ?3/23 0.88 4.7    ?      ? ?There is no hemoglobin in the chart, I cannot imagine that she did not have it done preoperatively. ? ? ?Past Medical History:  ?Diagnosis Date  ? AK (actinic keratosis) 2012  ? LEFT NOSE   ? Chondromalacia patellae of right knee 2013  ? Dryness of eye   ? DUE TO THYROID   ? Foot pain   ? Fracture of metatarsal bone   ? G2P2   ? Ganglion cyst of wrist, right   ? H/O varicose veins 2016  ? Humerus fracture 2011  ? FELL ON ICE AND FRACTURED GREATER TUBEROSITY  ? Hyperlipidemia   ? Hypothyroidism   ? Lumbar disc herniation   ? Macular hole of right eye   ? Menopause   ? AGE   ? Osteopenia   ? Perforated ear drum, left 07/2009  ? Plantar fasciitis   ? Retinal hemorrhage of right eye   ? 10/9, 10/10, 3/10, 2013  ? Squamous cell carcinoma in situ (SCCIS) of left foot 06/2014  ? ANKLE  ? TGA (transient global amnesia) 10/2015  ? Thumb fracture   ? AGE 36  ? Varicose veins   ?   ? ?Surgical History:  ?Past Surgical History:  ?Procedure Laterality  Date  ? APPENDECTOMY    ? CYSTECTOMY Left 1978  ? left ovary removed  ? EYE SURGERY  2008  ? PUNCTAL MENISCUS CLEANED  ? LIPOMA EXCISION Right 12/31/2019  ? Procedure: Excision lipoma right back;  Surgeon: [name], MD;  Location: [facility];  Service: Plastics;  Laterality: Right;  35 min  ? MOHS SURGERY    ? OTHER SURGICAL HISTORY  02/2015  ? EARDRUM PATCH  ?  ? ?Home Meds: ?Current Meds  ?Medication Sig  ? Calcium Citrate-Vitamin D (CALCIUM CITRATE + D PO) Take by mouth. Takes 600-500  daily  ? Cholecalciferol 25 MCG (1000 UT) tablet Take 2,000 Int'l Units by mouth daily.  ? estradiol (VIVELLE-DOT) 0.05 MG/24HR patch Place 1 patch onto the skin as directed. Wears patch for 5 days a week  ? levothyroxine (SYNTHROID) 112 MCG tablet Take 1 tablet by mouth as directed. Take 1 tablet by mouth 6 days a week, then 1/2 tablet by  mouth once a week  ? Multiple Vitamin (MULTIVITAMIN) capsule Take 1 capsule by mouth daily.  ? Omega-3 Fatty Acids (FISH OIL) 1000 MG CAPS Take 1,280 mg by mouth daily.  ? Propylene Glycol (SYSTANE COMPLETE OP) Apply 1 drop to eye 2 (two) times daily as needed.  ? S-Adenosylmethionine (SAM-E) 400 MG TABS Take 1 tablet by mouth daily.  ? XIIDRA 5 % SOLN INT ONE GTT IN OU BID  ? ? ?Allergies: No Known Allergies ? ?Social History  ? ?Socioeconomic History  ? Marital status: Divorced  ?  Spouse name: Not on file  ? Number of children: Not on file  ? Years of education: Not on file  ? Highest education level: Not on file  ?Occupational History  ? Not on file  ?Tobacco Use  ? Smoking status: Never  ? Smokeless tobacco: Never  ?Substance and Sexual Activity  ? Alcohol use: Not Currently  ?  Alcohol/week: 1.0 standard drink  ?  Types: 1 Glasses of wine per week  ?  Comment: last drink 11/2015  ? Drug use: No  ? Sexual activity: Not on file  ?Other Topics Concern  ? Not on file  ?Social History Narrative  ? Not on file  ? ?Social Determinants of Health  ? ?Financial Resource Strain: Not on file  ?Food Insecurity: Not on file  ?Transportation Needs: Not on file  ?Physical Activity: Not on file  ?Stress: Not on file  ?Social Connections: Not on file  ?Intimate Partner Violence: Not on file  ?  ? ?Family History  ?Problem Relation Age of Onset  ? Dementia Mother   ? Stroke Father   ? Lymphoma Brother   ?  ?  ?ROS:  Please see the history of present illness.     All other systems reviewed and negative.  ? ? ?Physical Exam: ?Blood pressure 132/78, pulse 62, height '5\' 5"'$  (1.651 m), weight 178 lb (80.7 kg), SpO2 96 %. ?General: Well developed, well nourished female in no acute distress. ?Head: Normocephalic, atraumatic, sclera non-icteric, no xanthomas, nares are without discharge. ?EENT: normal  ?Lymph Nodes:  none ?Neck: Negative for carotid bruits. JVD not elevated. ?Back:without scoliosis kyphosis ?Lungs: Clear bilaterally to  auscultation without wheezes, rales, or rhonchi. Breathing is unlabored. ?Heart: RRR with S1 S2. No murmurs or rubs, or gallops appreciated. ?Abdomen: Soft, non-tender, non-distended with normoactive bowel sounds. No hepatomegaly. No rebound/guarding. No obvious abdominal masses. ?Msk:  Strength and tone appear normal for age. ?Extremities: No clubbing  or cyanosis. No  edema.  Distal pedal pulses are 2+ and equal bilaterally. ?Skin: Warm and Dry ?Neuro: Alert and oriented X 3. CN III-XII intact Grossly normal sensory and motor function . ?Psych:  Responds to questions appropriately with a normal affect. ?  ?  ?  ? ?EKG: Sinus rhythm at 62 ?Intervals 02/12/1939 ? ? ?Assessment and Plan:  ?COVID 12/22 ? ?Dyspnea/bendopnea new Post COVID ? ?Palpitation syndromes 1-pounding 2-racing ? ?Orthostatic visual loss ? ?Elevated blood pressure ? ? ?In the wake of her COVID infection, she has residual dyspnea and bendopnea.  The latter is associated with congestive heart failure but she has no evidence on examination.  We will check a BNP.  If this is unrevealing, last discussions I had with pulmonary regarding residual dyspnea post-COVID suggested obtain a chest x-ray and pulmonary function testing.  If the BNP is abnormal, we will undertake an echocardiogram. ? ?Her palpitation syndromes are new post-COVID also.  We will use an event recorder to try to elucidate.  She asks repeatedly whether they are related to anxiety as she is undergoing major life transitions moving to [place]. ? ?Her blood pressures are significantly elevated.  She says this is also new post COVID.  Orthostatic vital signs today were largely unilluminating.  She had symptoms of lightheadedness despite no discernible changes in her vital signs. ? ? ? ? ? ? ?Virl Axe  ?

## 2021-05-12 NOTE — Patient Instructions (Addendum)
Medication Instructions:  ?Your physician recommends that you continue on your current medications as directed. Please refer to the Current Medication list given to you today. ? ?*If you need a refill on your cardiac medications before your next appointment, please call your pharmacy* ? ? ?Lab Work: ?Today: BNP ?If you have labs (blood work) drawn today and your tests are completely normal, you will receive your results only by: ?MyChart Message (if you have MyChart) OR ?A paper copy in the mail ?If you have any lab test that is abnormal or we need to change your treatment, we will call you to review the results. ? ? ?Testing/Procedures: ?                         ? ?ZIO XT- Long Term Monitor Instructions ? ?Your physician has requested you wear a ZIO patch monitor for 14 days.  ?This is a single patch monitor. [employer] supplies one patch monitor per enrollment. Additional ?stickers are not available. Please do not apply patch if you will be having a Nuclear Stress Test,  ?Echocardiogram, Cardiac CT, MRI, or Chest Xray during the period you would be wearing the  ?monitor. The patch cannot be worn during these tests. You cannot remove and re-apply the  ?ZIO XT patch monitor.  ?Your ZIO patch monitor will be mailed 3 day USPS to your address on file. It may take 3-5 days  ?to receive your monitor after you have been enrolled.  ?Once you have received your monitor, please review the enclosed instructions. Your monitor  ?has already been registered assigning a specific monitor serial # to you. ? ?Billing and Patient Assistance Program Information ? ?We have supplied [employer] with any of your insurance information on file for billing purposes. ?[employer] offers a sliding scale Patient Assistance Program for patients that do not have  ?insurance, or whose insurance does not completely cover the cost of the ZIO monitor.  ?You must apply for the Patient Assistance Program to qualify for this discounted rate.  ?To apply, please  call [employer] at [phone], select option 4, select option 2, ask to apply for  ?Patient Assistance Program. Theodore Demark will ask your household income, and how many people  ?are in your household. They will quote your out-of-pocket cost based on that information.  ?[employer] will also be able to set up a 68-month interest-free payment plan if needed. ? ?Applying the monitor ?  ?Shave hair from upper left chest.  ?Hold abrader disc by orange tab. Rub abrader in 40 strokes over the upper left chest as  ?indicated in your monitor instructions.  ?Clean area with 4 enclosed alcohol pads. Let dry.  ?Apply patch as indicated in monitor instructions. Patch will be placed under collarbone on left  ?side of chest with arrow pointing upward.  ?Rub patch adhesive wings for 2 minutes. Remove white label marked "1". Remove the white  ?label marked "2". Rub patch adhesive wings for 2 additional minutes.  ?While looking in a mirror, press and release button in center of patch. A small green light will  ?flash 3-4 times. This will be your only indicator that the monitor has been turned on.  ?Do not shower for the first 24 hours. You may shower after the first 24 hours.  ?Press the button if you feel a symptom. You will hear a small click. Record Date, Time and  ?Symptom in the Patient Logbook.  ?When you are ready to remove the  patch, follow instructions on the last 2 pages of Patient  ?Logbook. Stick patch monitor onto the last page of Patient Logbook.  ?Place Patient Logbook in the blue and white box. Use locking tab on box and tape box closed  ?securely. The blue and white box has prepaid postage on it. Please place it in the mailbox as  ?soon as possible. Your physician should have your test results approximately 7 days after the  ?monitor has been mailed back to [facility].  ?Call [facility] at [phone] if you have questions regarding  ?your ZIO XT patch monitor. Call them immediately if you see an  orange light blinking on your  ?monitor.  ?If your monitor falls off in less than 4 days, contact our Monitor department at [phone].  ?If your monitor becomes loose or falls off after 4 days call [employer] at [phone] for  ?suggestions on securing your monitor ? ? ?Follow-Up: ?At [facility], you and your health needs are our priority.  As part of our continuing mission to provide you with exceptional heart care, we have created designated Provider Care Teams.  These Care Teams include your primary Cardiologist (physician) and Advanced Practice Providers (APPs -  Physician Assistants and Nurse Practitioners) who all work together to provide you with the care you need, when you need it. ? ?Your next appointment:   ?4 - 6 week(s) ? ?The format for your next appointment:   ?Virtual Visit  ? ?Provider:   ?Virl Axe, MD ? ? ? ?Thank you for choosing [facility]!! ? ? ? ?

## 2021-05-13 LAB — PRO B NATRIURETIC PEPTIDE: NT-Pro BNP: 78 pg/mL (ref 0–301)

## 2021-05-17 DIAGNOSIS — R002 Palpitations: Secondary | ICD-10-CM | POA: Diagnosis not present

## 2021-05-26 DIAGNOSIS — M545 Low back pain, unspecified: Secondary | ICD-10-CM | POA: Diagnosis not present

## 2021-05-26 DIAGNOSIS — H8111 Benign paroxysmal vertigo, right ear: Secondary | ICD-10-CM | POA: Diagnosis not present

## 2021-05-26 DIAGNOSIS — M25511 Pain in right shoulder: Secondary | ICD-10-CM | POA: Diagnosis not present

## 2021-05-28 DIAGNOSIS — H04123 Dry eye syndrome of bilateral lacrimal glands: Secondary | ICD-10-CM | POA: Diagnosis not present

## 2021-05-28 DIAGNOSIS — H40043 Steroid responder, bilateral: Secondary | ICD-10-CM | POA: Diagnosis not present

## 2021-05-28 DIAGNOSIS — H35341 Macular cyst, hole, or pseudohole, right eye: Secondary | ICD-10-CM | POA: Diagnosis not present

## 2021-06-01 DIAGNOSIS — H04123 Dry eye syndrome of bilateral lacrimal glands: Secondary | ICD-10-CM | POA: Diagnosis not present

## 2021-06-01 DIAGNOSIS — H40053 Ocular hypertension, bilateral: Secondary | ICD-10-CM | POA: Diagnosis not present

## 2021-06-07 DIAGNOSIS — R002 Palpitations: Secondary | ICD-10-CM | POA: Diagnosis not present

## 2021-06-10 ENCOUNTER — Encounter: Payer: Self-pay | Admitting: Internal Medicine

## 2021-06-10 ENCOUNTER — Institutional Professional Consult (permissible substitution): Payer: Medicare Other | Admitting: Internal Medicine

## 2021-06-10 ENCOUNTER — Telehealth: Payer: Medicare Other | Admitting: Internal Medicine

## 2021-06-10 VITALS — BP 145/89 | HR 71 | Ht 65.0 in | Wt 170.0 lb

## 2021-06-10 DIAGNOSIS — R002 Palpitations: Secondary | ICD-10-CM

## 2021-06-14 ENCOUNTER — Telehealth: Payer: Self-pay | Admitting: Internal Medicine

## 2021-06-14 NOTE — Telephone Encounter (Signed)
Pt sent this VIA MyChart message to the scheduling Pool:   ? ? I waited in the online ?[facility] waiting room? from 3:30pm to 5:45pm on 06/10/21 after the nursing assistant called me for the initial visit check-in screening. After waiting for more than 2 hours, I called the office which had closed at that time. For the record, I did not ?leave without being seen?. The clinic left me. Orest Dikes, MD ?

## 2021-06-15 NOTE — Telephone Encounter (Signed)
Attempted phone call to pt and left voicemail message advising RN will also send a MyChart message re: rescheduling with Dr Caryl Comes. ?

## 2021-06-17 DIAGNOSIS — Z9842 Cataract extraction status, left eye: Secondary | ICD-10-CM | POA: Diagnosis not present

## 2021-06-17 DIAGNOSIS — Z9841 Cataract extraction status, right eye: Secondary | ICD-10-CM | POA: Diagnosis not present

## 2021-06-17 DIAGNOSIS — Z961 Presence of intraocular lens: Secondary | ICD-10-CM | POA: Diagnosis not present

## 2021-06-17 DIAGNOSIS — H26492 Other secondary cataract, left eye: Secondary | ICD-10-CM | POA: Diagnosis not present

## 2021-06-17 DIAGNOSIS — H26491 Other secondary cataract, right eye: Secondary | ICD-10-CM | POA: Diagnosis not present

## 2021-06-23 DIAGNOSIS — H40053 Ocular hypertension, bilateral: Secondary | ICD-10-CM | POA: Diagnosis not present

## 2021-07-07 ENCOUNTER — Telehealth: Payer: Medicare Other | Admitting: Internal Medicine

## 2021-07-07 DIAGNOSIS — H40053 Ocular hypertension, bilateral: Secondary | ICD-10-CM | POA: Diagnosis not present

## 2021-07-14 DIAGNOSIS — H40053 Ocular hypertension, bilateral: Secondary | ICD-10-CM | POA: Diagnosis not present

## 2021-07-21 ENCOUNTER — Encounter: Payer: Self-pay | Admitting: Internal Medicine

## 2021-07-27 ENCOUNTER — Ambulatory Visit (INDEPENDENT_AMBULATORY_CARE_PROVIDER_SITE_OTHER): Payer: Medicare Other | Admitting: Internal Medicine

## 2021-07-27 ENCOUNTER — Encounter: Payer: Self-pay | Admitting: Internal Medicine

## 2021-07-27 VITALS — BP 146/86 | HR 62 | Ht 65.0 in | Wt 172.0 lb

## 2021-07-27 DIAGNOSIS — I5189 Other ill-defined heart diseases: Secondary | ICD-10-CM

## 2021-07-27 DIAGNOSIS — R002 Palpitations: Secondary | ICD-10-CM | POA: Diagnosis not present

## 2021-07-27 DIAGNOSIS — I472 Ventricular tachycardia, unspecified: Secondary | ICD-10-CM

## 2021-07-27 NOTE — Patient Instructions (Signed)
Medication Instructions:  Your physician recommends that you continue on your current medications as directed. Please refer to the Current Medication list given to you today.  *If you need a refill on your cardiac medications before your next appointment, please call your pharmacy*   Lab Work: None ordered.  If you have labs (blood work) drawn today and your tests are completely normal, you will receive your results only by: [place] (if you have MyChart) OR A paper copy in the mail If you have any lab test that is abnormal or we need to change your treatment, we will call you to review the results.   Testing/Procedures: You will be contacted for scheduling.  Your physician has requested that you have an echocardiogram. Echocardiography is a painless test that uses sound waves to create images of your heart. It provides your doctor with information about the size and shape of your heart and how well your heart's chambers and valves are working. This procedure takes approximately one hour. There are no restrictions for this procedure.    Follow-Up: At [facility], you and your health needs are our priority.  As part of our continuing mission to provide you with exceptional heart care, we have created designated Provider Care Teams.  These Care Teams include your primary Cardiologist (physician) and Advanced Practice Providers (APPs -  Physician Assistants and Nurse Practitioners) who all work together to provide you with the care you need, when you need it.  We recommend signing up for the patient portal called "MyChart".  Sign up information is provided on this After Visit Summary.  MyChart is used to connect with patients for Virtual Visits (Telemedicine).  Patients are able to view lab/test results, encounter notes, upcoming appointments, etc.  Non-urgent messages can be sent to your provider as well.   To learn more about what you can do with MyChart, go to  [email or web address].    Your next appointment:   12 months with Dr Caryl Comes  Important Information About Sugar

## 2021-07-28 DIAGNOSIS — H04123 Dry eye syndrome of bilateral lacrimal glands: Secondary | ICD-10-CM | POA: Diagnosis not present

## 2021-07-28 DIAGNOSIS — H40053 Ocular hypertension, bilateral: Secondary | ICD-10-CM | POA: Diagnosis not present

## 2021-07-29 ENCOUNTER — Other Ambulatory Visit: Payer: Medicare Other

## 2021-07-31 NOTE — Progress Notes (Signed)
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 COVID 12/22 with post COVID dyspnea and palpitations  Eval included an event recorder, with no arrhythmia but she also notes about the time of application of the monitor, that her symptoms abated  In this regard her dyspnea also has resolved;  eval had included BNP normal    Records and Results Reviewed   Past Medical History:  Diagnosis Date   AK (actinic keratosis) 2012   LEFT NOSE    Chondromalacia patellae of right knee 2013   Dryness of eye    DUE TO THYROID    Foot pain    Fracture of metatarsal bone    G2P2    Ganglion cyst of wrist, right    H/O varicose veins 2016   Humerus fracture 2011   FELL ON ICE AND FRACTURED GREATER TUBEROSITY   Hyperlipidemia    Hypothyroidism    Lumbar disc herniation    Macular hole of right eye    Menopause    AGE    Osteopenia    Perforated ear drum, left 07/2009   Plantar fasciitis    Retinal hemorrhage of right eye    10/9, 10/10, 3/10, 2013   Squamous cell carcinoma in situ (SCCIS) of left foot 06/2014   ANKLE   TGA (transient global amnesia) 10/2015   Thumb fracture    AGE 53   Varicose veins     Past Surgical History:  Procedure Laterality Date   APPENDECTOMY     CYSTECTOMY Left 1978   left ovary removed   EYE SURGERY  2008   PUNCTAL MENISCUS CLEANED   LIPOMA EXCISION Right 12/31/2019   Procedure: Excision lipoma right back;  Surgeon: [name], MD;  Location: [facility];  Service: Plastics;  Laterality: Right;  35 min   MOHS SURGERY     OTHER SURGICAL HISTORY  02/2015   EARDRUM PATCH    Current Meds  Medication Sig   brimonidine (ALPHAGAN) 0.2 % ophthalmic solution Apply 1 drop to eye in the morning and at bedtime.    Calcium Citrate-Vitamin D (CALCIUM CITRATE + D PO) Take by mouth. Takes 600-500  daily   Cholecalciferol 25 MCG (1000 UT) tablet Take 2,000 Int'l Units by mouth daily.   Coenzyme Q10 (COQ-10) 100 MG CAPS Take 1 capsule by mouth daily.   dorzolamide (TRUSOPT) 2 % ophthalmic solution Apply 1 drop to eye in the morning and at bedtime.   estradiol (VIVELLE-DOT) 0.05 MG/24HR patch Place 1 patch onto the skin as directed. Wears patch for 5 days a week   levothyroxine (SYNTHROID) 112 MCG tablet Take 1 tablet by mouth 6 days a week, then 1.5 tablet by mouth once a week   Multiple Vitamin (MULTIVITAMIN) capsule Take 1 capsule by mouth daily.   Omega-3 Fatty Acids (FISH OIL) 1000 MG CAPS Take 1,280 mg by mouth daily.   Quercetin 500 MG CAPS Take 1 capsule by mouth daily.   RHOPRESSA 0.02 % SOLN Place 1 drop into both eyes daily.   S-Adenosylmethionine (SAM-E) 400 MG TABS Take 1 tablet by mouth daily.   Spirulina 500 MG TABS Take 1 tablet by mouth in the morning and at bedtime.   TURMERIC PO Take by mouth. Take '1160mg'$  once a day  XIIDRA 5 % SOLN INT ONE GTT IN OU BID    Allergies  Allergen Reactions   Atorvastatin Other (See Comments)   Ezetimibe Other (See Comments)   Prednisolone     Other reaction(s): Other (See Comments), Other (See Comments) Steroid responder Increase ocular presure    Rosuvastatin     Other reaction(s): rapid onset of severe sxs. aches, etc... 3/17   Statins     Other reaction(s): Muscle Pain, Other (See Comments)      Review of Systems negative except from HPI and PMH  Physical Exam BP (!) 146/86   Pulse 62   Ht '5\' 5"'$  (1.651 m)   Wt 172 lb (78 kg)   SpO2 95%   BMI 28.62 kg/m  Well developed and well nourished in no acute distress HENT normal E scleral and icterus clear Neck Supple JVP flat; carotids brisk and full Clear to ausculation Regular rate and rhythm, no murmurs gallops or rub Soft with active bowel sounds No clubbing cyanosis  Edema Alert and  oriented, grossly normal motor and sensory function Skin Warm and Dry  ECG   CrCl cannot be calculated (Patient's most recent lab result is older than the maximum 21 days allowed.).   Assessment and  Plan COVID 12/22   Dyspnea/bendopnea new Post COVID   Palpitation syndromes 1-pounding 2-racing   Orthostatic visual loss   Elevated blood pressure  Much improved; it seems like the tincture of time resolved these symptoms post covid .  I think I repeated the BP and it was normal but I do not recall at this late date   Current medicines are reviewed at length with the patient today .  The patient does not  have concerns regarding medicines.

## 2021-08-04 ENCOUNTER — Other Ambulatory Visit: Payer: Self-pay | Admitting: Internal Medicine

## 2021-08-04 DIAGNOSIS — Z1231 Encounter for screening mammogram for malignant neoplasm of breast: Secondary | ICD-10-CM

## 2021-08-06 DIAGNOSIS — E039 Hypothyroidism, unspecified: Secondary | ICD-10-CM | POA: Diagnosis not present

## 2021-08-06 DIAGNOSIS — E785 Hyperlipidemia, unspecified: Secondary | ICD-10-CM | POA: Diagnosis not present

## 2021-08-10 DIAGNOSIS — E039 Hypothyroidism, unspecified: Secondary | ICD-10-CM | POA: Diagnosis not present

## 2021-08-10 DIAGNOSIS — Z79890 Hormone replacement therapy (postmenopausal): Secondary | ICD-10-CM | POA: Diagnosis not present

## 2021-08-13 DIAGNOSIS — H40053 Ocular hypertension, bilateral: Secondary | ICD-10-CM | POA: Diagnosis not present

## 2021-08-31 DIAGNOSIS — H04123 Dry eye syndrome of bilateral lacrimal glands: Secondary | ICD-10-CM | POA: Diagnosis not present

## 2021-08-31 DIAGNOSIS — H40053 Ocular hypertension, bilateral: Secondary | ICD-10-CM | POA: Diagnosis not present

## 2021-09-03 ENCOUNTER — Ambulatory Visit (INDEPENDENT_AMBULATORY_CARE_PROVIDER_SITE_OTHER): Payer: Medicare Other

## 2021-09-03 DIAGNOSIS — I472 Ventricular tachycardia, unspecified: Secondary | ICD-10-CM | POA: Diagnosis not present

## 2021-09-03 DIAGNOSIS — I5189 Other ill-defined heart diseases: Secondary | ICD-10-CM

## 2021-09-03 LAB — ECHOCARDIOGRAM COMPLETE
AR max vel: 2.84 cm2
AV Area VTI: 2.65 cm2
AV Area mean vel: 2.83 cm2
AV Mean grad: 2 mmHg
AV Peak grad: 4.1 mmHg
Ao pk vel: 1.01 m/s
Area-P 1/2: 2.55 cm2
Calc EF: 51.9 %
S' Lateral: 2.1 cm
Single Plane A2C EF: 53.2 %
Single Plane A4C EF: 53.9 %

## 2021-09-10 DIAGNOSIS — E039 Hypothyroidism, unspecified: Secondary | ICD-10-CM | POA: Diagnosis not present

## 2021-09-27 ENCOUNTER — Ambulatory Visit: Payer: Medicare Other

## 2021-09-28 ENCOUNTER — Ambulatory Visit
Admission: RE | Admit: 2021-09-28 | Discharge: 2021-09-28 | Disposition: A | Discharge status: Home / Self Care | Payer: Medicare Other | Source: Ambulatory Visit | Attending: Internal Medicine | Admitting: Internal Medicine

## 2021-09-28 DIAGNOSIS — Z1231 Encounter for screening mammogram for malignant neoplasm of breast: Secondary | ICD-10-CM

## 2021-09-30 ENCOUNTER — Other Ambulatory Visit: Payer: Self-pay | Admitting: Internal Medicine

## 2021-09-30 DIAGNOSIS — R928 Other abnormal and inconclusive findings on diagnostic imaging of breast: Secondary | ICD-10-CM

## 2021-10-12 ENCOUNTER — Ambulatory Visit
Admission: RE | Admit: 2021-10-12 | Discharge: 2021-10-12 | Disposition: A | Discharge status: Home / Self Care | Payer: Medicare Other | Source: Ambulatory Visit | Attending: Internal Medicine | Admitting: Internal Medicine

## 2021-10-12 DIAGNOSIS — N641 Fat necrosis of breast: Secondary | ICD-10-CM | POA: Diagnosis not present

## 2021-10-12 DIAGNOSIS — R928 Other abnormal and inconclusive findings on diagnostic imaging of breast: Secondary | ICD-10-CM

## 2021-10-15 DIAGNOSIS — H04123 Dry eye syndrome of bilateral lacrimal glands: Secondary | ICD-10-CM | POA: Diagnosis not present

## 2021-10-15 DIAGNOSIS — H40053 Ocular hypertension, bilateral: Secondary | ICD-10-CM | POA: Diagnosis not present

## 2021-11-09 DIAGNOSIS — H04123 Dry eye syndrome of bilateral lacrimal glands: Secondary | ICD-10-CM | POA: Diagnosis not present

## 2021-11-09 DIAGNOSIS — H40053 Ocular hypertension, bilateral: Secondary | ICD-10-CM | POA: Diagnosis not present

## 2021-11-22 DIAGNOSIS — Z6834 Body mass index (BMI) 34.0-34.9, adult: Secondary | ICD-10-CM | POA: Diagnosis not present

## 2021-11-22 DIAGNOSIS — Z01419 Encounter for gynecological examination (general) (routine) without abnormal findings: Secondary | ICD-10-CM | POA: Diagnosis not present

## 2021-12-06 DIAGNOSIS — Z23 Encounter for immunization: Secondary | ICD-10-CM | POA: Diagnosis not present

## 2021-12-08 DIAGNOSIS — Z23 Encounter for immunization: Secondary | ICD-10-CM | POA: Diagnosis not present

## 2021-12-10 DIAGNOSIS — M810 Age-related osteoporosis without current pathological fracture: Secondary | ICD-10-CM | POA: Diagnosis not present

## 2021-12-10 DIAGNOSIS — E039 Hypothyroidism, unspecified: Secondary | ICD-10-CM | POA: Diagnosis not present

## 2021-12-14 DIAGNOSIS — H40053 Ocular hypertension, bilateral: Secondary | ICD-10-CM | POA: Diagnosis not present

## 2021-12-14 DIAGNOSIS — Z961 Presence of intraocular lens: Secondary | ICD-10-CM | POA: Diagnosis not present

## 2021-12-14 DIAGNOSIS — H35373 Puckering of macula, bilateral: Secondary | ICD-10-CM | POA: Diagnosis not present

## 2021-12-14 DIAGNOSIS — H04123 Dry eye syndrome of bilateral lacrimal glands: Secondary | ICD-10-CM | POA: Diagnosis not present

## 2021-12-17 DIAGNOSIS — E039 Hypothyroidism, unspecified: Secondary | ICD-10-CM | POA: Diagnosis not present

## 2021-12-17 DIAGNOSIS — M810 Age-related osteoporosis without current pathological fracture: Secondary | ICD-10-CM | POA: Diagnosis not present

## 2021-12-17 DIAGNOSIS — Z79890 Hormone replacement therapy (postmenopausal): Secondary | ICD-10-CM | POA: Diagnosis not present

## 2021-12-17 DIAGNOSIS — E785 Hyperlipidemia, unspecified: Secondary | ICD-10-CM | POA: Diagnosis not present

## 2021-12-20 DIAGNOSIS — H40053 Ocular hypertension, bilateral: Secondary | ICD-10-CM | POA: Diagnosis not present

## 2021-12-21 DIAGNOSIS — Z0000 Encounter for general adult medical examination without abnormal findings: Secondary | ICD-10-CM | POA: Diagnosis not present

## 2021-12-21 DIAGNOSIS — Z1339 Encounter for screening examination for other mental health and behavioral disorders: Secondary | ICD-10-CM | POA: Diagnosis not present

## 2021-12-21 DIAGNOSIS — M810 Age-related osteoporosis without current pathological fracture: Secondary | ICD-10-CM | POA: Diagnosis not present

## 2021-12-21 DIAGNOSIS — E785 Hyperlipidemia, unspecified: Secondary | ICD-10-CM | POA: Diagnosis not present

## 2021-12-21 DIAGNOSIS — I499 Cardiac arrhythmia, unspecified: Secondary | ICD-10-CM | POA: Diagnosis not present

## 2021-12-21 DIAGNOSIS — M8580 Other specified disorders of bone density and structure, unspecified site: Secondary | ICD-10-CM | POA: Diagnosis not present

## 2021-12-21 DIAGNOSIS — Z23 Encounter for immunization: Secondary | ICD-10-CM | POA: Diagnosis not present

## 2021-12-21 DIAGNOSIS — Z1331 Encounter for screening for depression: Secondary | ICD-10-CM | POA: Diagnosis not present

## 2022-01-06 DIAGNOSIS — Z9889 Other specified postprocedural states: Secondary | ICD-10-CM | POA: Diagnosis not present

## 2022-01-06 DIAGNOSIS — Z09 Encounter for follow-up examination after completed treatment for conditions other than malignant neoplasm: Secondary | ICD-10-CM | POA: Diagnosis not present

## 2022-01-07 DIAGNOSIS — L570 Actinic keratosis: Secondary | ICD-10-CM | POA: Diagnosis not present

## 2022-01-17 DIAGNOSIS — H40053 Ocular hypertension, bilateral: Secondary | ICD-10-CM | POA: Diagnosis not present

## 2022-02-02 DIAGNOSIS — H52203 Unspecified astigmatism, bilateral: Secondary | ICD-10-CM | POA: Diagnosis not present

## 2022-02-02 DIAGNOSIS — H04123 Dry eye syndrome of bilateral lacrimal glands: Secondary | ICD-10-CM | POA: Diagnosis not present

## 2022-02-02 DIAGNOSIS — Z961 Presence of intraocular lens: Secondary | ICD-10-CM | POA: Diagnosis not present

## 2022-02-02 DIAGNOSIS — H40053 Ocular hypertension, bilateral: Secondary | ICD-10-CM | POA: Diagnosis not present

## 2022-02-02 DIAGNOSIS — H524 Presbyopia: Secondary | ICD-10-CM | POA: Diagnosis not present

## 2022-03-22 DIAGNOSIS — H40053 Ocular hypertension, bilateral: Secondary | ICD-10-CM | POA: Diagnosis not present

## 2022-04-14 DIAGNOSIS — M810 Age-related osteoporosis without current pathological fracture: Secondary | ICD-10-CM | POA: Diagnosis not present

## 2022-04-14 DIAGNOSIS — E785 Hyperlipidemia, unspecified: Secondary | ICD-10-CM | POA: Diagnosis not present

## 2022-04-14 DIAGNOSIS — E039 Hypothyroidism, unspecified: Secondary | ICD-10-CM | POA: Diagnosis not present

## 2022-04-15 DIAGNOSIS — E039 Hypothyroidism, unspecified: Secondary | ICD-10-CM | POA: Diagnosis not present

## 2022-04-15 DIAGNOSIS — M8580 Other specified disorders of bone density and structure, unspecified site: Secondary | ICD-10-CM | POA: Diagnosis not present

## 2022-04-15 DIAGNOSIS — E785 Hyperlipidemia, unspecified: Secondary | ICD-10-CM | POA: Diagnosis not present

## 2022-04-15 DIAGNOSIS — Z79890 Hormone replacement therapy (postmenopausal): Secondary | ICD-10-CM | POA: Diagnosis not present

## 2022-04-15 DIAGNOSIS — M810 Age-related osteoporosis without current pathological fracture: Secondary | ICD-10-CM | POA: Diagnosis not present

## 2022-08-31 ENCOUNTER — Other Ambulatory Visit: Payer: Self-pay

## 2022-08-31 DIAGNOSIS — Z1231 Encounter for screening mammogram for malignant neoplasm of breast: Secondary | ICD-10-CM

## 2022-10-12 ENCOUNTER — Other Ambulatory Visit: Payer: Self-pay | Admitting: Internal Medicine

## 2022-10-12 DIAGNOSIS — Z1231 Encounter for screening mammogram for malignant neoplasm of breast: Secondary | ICD-10-CM

## 2022-10-17 DIAGNOSIS — Z1231 Encounter for screening mammogram for malignant neoplasm of breast: Secondary | ICD-10-CM

## 2022-10-26 ENCOUNTER — Ambulatory Visit
Admission: RE | Admit: 2022-10-26 | Discharge: 2022-10-26 | Disposition: A | Discharge status: Home / Self Care | Payer: Medicare Other | Source: Ambulatory Visit

## 2022-10-26 DIAGNOSIS — Z1231 Encounter for screening mammogram for malignant neoplasm of breast: Secondary | ICD-10-CM

## 2022-11-16 DIAGNOSIS — R002 Palpitations: Secondary | ICD-10-CM | POA: Insufficient documentation

## 2022-11-17 ENCOUNTER — Encounter: Payer: Self-pay | Admitting: Internal Medicine

## 2022-11-17 ENCOUNTER — Ambulatory Visit: Payer: Medicare Other | Attending: Internal Medicine | Admitting: Internal Medicine

## 2022-11-17 VITALS — BP 158/92 | HR 70 | Ht 65.0 in | Wt 178.2 lb

## 2022-11-17 DIAGNOSIS — R0602 Shortness of breath: Secondary | ICD-10-CM | POA: Diagnosis present

## 2022-11-17 DIAGNOSIS — R002 Palpitations: Secondary | ICD-10-CM | POA: Diagnosis present

## 2022-11-17 DIAGNOSIS — I5189 Other ill-defined heart diseases: Secondary | ICD-10-CM | POA: Diagnosis present

## 2022-11-17 DIAGNOSIS — I472 Ventricular tachycardia, unspecified: Secondary | ICD-10-CM | POA: Insufficient documentation

## 2022-11-17 NOTE — Progress Notes (Signed)
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 COVID 12/22 with post COVID dyspnea and palpitations  Eval included an event recorder, with no arrhythmia but she also notes about the time of application of the monitor, that her symptoms abated  Comes in with many questions about CV risk   Her LDL is 109; CaScore 0  BP at home 110's     Records and Results Reviewed   Past Medical History:  Diagnosis Date   AK (actinic keratosis) 2012   LEFT NOSE    Chondromalacia patellae of right knee 2013   Dryness of eye    DUE TO THYROID    Foot pain    Fracture of metatarsal bone    G2P2    Ganglion cyst of wrist, right    H/O varicose veins 2016   Humerus fracture 2011   FELL ON ICE AND FRACTURED GREATER TUBEROSITY   Hyperlipidemia    Hypothyroidism    Lumbar disc herniation    Macular hole of right eye    Menopause    AGE    Osteopenia    Perforated ear drum, left 07/2009   Plantar fasciitis    Retinal hemorrhage of right eye    10/9, 10/10, 3/10, 2013   Squamous cell carcinoma in situ (SCCIS) of left foot 06/2014   ANKLE   TGA (transient global amnesia) 10/2015   Thumb fracture    AGE 57   Varicose veins     Past Surgical History:  Procedure Laterality Date   APPENDECTOMY     CYSTECTOMY Left 1978   left ovary removed   EYE SURGERY  2008   PUNCTAL MENISCUS CLEANED   LIPOMA EXCISION Right 12/31/2019   Procedure: Excision lipoma right back;  Surgeon: Allena Napoleon, MD;  Location: [facility];  Service: Plastics;  Laterality: Right;  35 min   MOHS SURGERY     OTHER SURGICAL HISTORY  02/2015   EARDRUM PATCH    Current Meds  Medication Sig   Calcium Citrate-Vitamin D (CALCIUM CITRATE + D PO) Take by mouth. Takes 600-500  daily    Cholecalciferol 25 MCG (1000 UT) tablet Take 2,000 Int'l Units by mouth daily.   Coenzyme Q10 (COQ-10) 100 MG CAPS Take 1 capsule by mouth daily.   estradiol (VIVELLE-DOT) 0.05 MG/24HR patch Place 1 patch onto the skin as directed. Wears patch for 5 days a week   levothyroxine (SYNTHROID) 112 MCG tablet Take 1 tablet by mouth 6 days a week, then 1.5 tablet by mouth once a week   Multiple Vitamin (MULTIVITAMIN) capsule Take 1 capsule by mouth daily.   Quercetin 500 MG CAPS Take 1 capsule by mouth daily.   S-Adenosylmethionine (SAM-E) 400 MG TABS Take 1 tablet by mouth daily.   Spirulina 500 MG TABS Take 1 tablet by mouth in the morning and at bedtime.   TURMERIC PO Take by mouth. Take 1160mg  once a day   XIIDRA 5 % SOLN INT ONE GTT IN OU BID    Allergies  Allergen Reactions   Atorvastatin Other (See Comments)   Ezetimibe Other (See Comments)   Prednisolone     Other reaction(s): Other (See Comments), Other (See Comments) Steroid responder Increase ocular presure    Rosuvastatin  Other reaction(s): rapid onset of severe sxs. aches, etc... 3/17   Statins     Other reaction(s): Muscle Pain, Other (See Comments)      Review of Systems negative except from HPI and PMH  Physical Exam BP (!) 140/96 (BP Location: Left Arm, Patient Position: Sitting, Cuff Size: Large)   Pulse 70   Ht 5\' 5"  (1.651 m)   Wt 178 lb 3.2 oz (80.8 kg)   SpO2 95%   BMI 29.65 kg/m  Well developed and nourished in no acute distress HENT normal Neck supple with JVP-  Flat  Clear Regular rate and rhythm, no murmurs or gallops Abd-soft with active BS No Clubbing cyanosis edema Skin-warm and dry A & Oriented  Grossly normal sensory and motor function  ECG sinus @ 73 13/08/41   CrCl cannot be calculated (Patient's most recent lab result is older than the maximum 21 days allowed.).   Assessment and  Plan COVID 12/22   Dyspnea/bendopnea new Post COVID   Palpitation syndromes 1-pounding 2-racing    Orthostatic visual loss   Elevated blood pressure   Dyspnea improved--thinks long COVID  Palpitations improved   Blood pressure remains elevated.  She correlated her monitor today and it is accurate so she has most of her home blood pressures in the 110s range no therapy is indicated  Current medicines are reviewed at length with the patient today .  The patient does not  have concerns regarding medicines. Blood pressure is somewhat obviously is unchanged after that so

## 2022-11-17 NOTE — Patient Instructions (Signed)
Medication Instructions:  Your physician recommends that you continue on your current medications as directed. Please refer to the Current Medication list given to you today.  *If you need a refill on your cardiac medications before your next appointment, please call your pharmacy*   Lab Work: None ordered.  If you have labs (blood work) drawn today and your tests are completely normal, you will receive your results only by: [place] (if you have MyChart) OR A paper copy in the mail If you have any lab test that is abnormal or we need to change your treatment, we will call you to review the results.   Testing/Procedures: None ordered.    Follow-Up: At [facility], you and your health needs are our priority.  As part of our continuing mission to provide you with exceptional heart care, we have created designated Provider Care Teams.  These Care Teams include your primary Cardiologist (physician) and Advanced Practice Providers (APPs -  Physician Assistants and Nurse Practitioners) who all work together to provide you with the care you need, when you need it.  We recommend signing up for the patient portal called "MyChart".  Sign up information is provided on this After Visit Summary.  MyChart is used to connect with patients for Virtual Visits (Telemedicine).  Patients are able to view lab/test results, encounter notes, upcoming appointments, etc.  Non-urgent messages can be sent to your provider as well.   To learn more about what you can do with MyChart, go to [email or web address].    Your next appointment:   Follow up as needed with Dr Caryl Comes

## 2022-11-23 ENCOUNTER — Ambulatory Visit (INDEPENDENT_AMBULATORY_CARE_PROVIDER_SITE_OTHER): Payer: Medicare Other | Admitting: Sports Medicine

## 2022-11-23 VITALS — BP 126/84 | Ht 65.0 in | Wt 172.0 lb

## 2022-11-23 DIAGNOSIS — M67959 Unspecified disorder of synovium and tendon, unspecified thigh: Secondary | ICD-10-CM | POA: Diagnosis not present

## 2022-11-23 NOTE — Progress Notes (Signed)
PCP: Rodrigo Ran, MD  Subjective:   HPI: Patient is a 72 y.o. female here for left-sided gluteus pain has been going on for a while now.  Patient states that she has stopped doing as much exercise since the pandemic and she has noticed that over a period of time she has more pain when she is sitting for prolonged periods.  Patient states that the pain feels like weakness of her left glue and sometimes radiates into her groin.  Patient states that she has pain after sitting for prolonged periods of time.  Patient notes some weakness with hip abduction.  Patient states that she has been doing a different strengthening program recently that requires hip abduction and she has noticed improvement in her symptoms slightly.  Patient not taking anything over-the-counter at this time.   Past Medical History:  Diagnosis Date   AK (actinic keratosis) 2012   LEFT NOSE    Chondromalacia patellae of right knee 2013   Dryness of eye    DUE TO THYROID    Foot pain    Fracture of metatarsal bone    G2P2    Ganglion cyst of wrist, right    H/O varicose veins 2016   Humerus fracture 2011   FELL ON ICE AND FRACTURED GREATER TUBEROSITY   Hyperlipidemia    Hypothyroidism    Lumbar disc herniation    Macular hole of right eye    Menopause    AGE    Osteopenia    Perforated ear drum, left 07/2009   Plantar fasciitis    Retinal hemorrhage of right eye    10/9, 10/10, 3/10, 2013   Squamous cell carcinoma in situ (SCCIS) of left foot 06/2014   ANKLE   TGA (transient global amnesia) 10/2015   Thumb fracture    AGE 72   Varicose veins     Current Outpatient Medications on File Prior to Visit  Medication Sig Dispense Refill   Calcium Citrate-Vitamin D (CALCIUM CITRATE + D PO) Take by mouth. Takes 600-500  daily     Cholecalciferol 25 MCG (1000 UT) tablet Take 2,000 Int'l Units by mouth daily.     Coenzyme Q10 (COQ-10) 100 MG CAPS Take 1 capsule by mouth daily.     estradiol (VIVELLE-DOT) 0.05 MG/24HR  patch Place 1 patch onto the skin as directed. Wears patch for 5 days a week     levothyroxine (SYNTHROID) 112 MCG tablet Take 1 tablet by mouth 6 days a week, then 1.5 tablet by mouth once a week     Multiple Vitamin (MULTIVITAMIN) capsule Take 1 capsule by mouth daily.     Omega-3 Fatty Acids (FISH OIL) 1000 MG CAPS Take 1,280 mg by mouth daily.     Quercetin 500 MG CAPS Take 1 capsule by mouth daily.     RHOPRESSA 0.02 % SOLN Place 1 drop into both eyes daily.     S-Adenosylmethionine (SAM-E) 400 MG TABS Take 1 tablet by mouth daily.     Spirulina 500 MG TABS Take 1 tablet by mouth in the morning and at bedtime.     TURMERIC PO Take by mouth. Take 1160mg  once a day     XIIDRA 5 % SOLN INT ONE GTT IN OU BID  0   No current facility-administered medications on file prior to visit.    Past Surgical History:  Procedure Laterality Date   APPENDECTOMY     CYSTECTOMY Left 1978   left ovary removed   EYE SURGERY  2008   PUNCTAL MENISCUS CLEANED   LIPOMA EXCISION Right 12/31/2019   Procedure: Excision lipoma right back;  Surgeon: Allena Napoleon, MD;  Location: [facility];  Service: Plastics;  Laterality: Right;  35 min   MOHS SURGERY     OTHER SURGICAL HISTORY  02/2015   EARDRUM PATCH    Allergies  Allergen Reactions   Atorvastatin Other (See Comments)   Ezetimibe Other (See Comments)   Prednisolone     Other reaction(s): Other (See Comments), Other (See Comments) Steroid responder Increase ocular presure    Rosuvastatin     Other reaction(s): rapid onset of severe sxs. aches, etc... 3/17   Statins     Other reaction(s): Muscle Pain, Other (See Comments)    BP 126/84   Ht 5\' 5"  (1.651 m)   Wt 172 lb (78 kg)   BMI 28.62 kg/m       No data to display              No data to display              Objective:  Physical Exam:  Gen: NAD, comfortable in exam room  Hip, left:. No obvious rash, erythema, ecchymosis, or edema. Passive Log Roll  equivalent b/l without restriction. ROM full in all directions; strength is 5 out of 5 in all directions except for abduction, strength is reduced against resistance significantly.  There is a mild limb length discrepancy with the left being slightly shorter than the right.  Difference is not significant. Non-antalgic gait without trendelenburg / unsteadiness. Greater trochanter without tenderness to palpation. No tenderness over piriformis. No SI joint tenderness and normal minimal SI movement.  Provocative Testing:    - FABER/FADIR test: POS/NEG    Marzella Schlein test: NEG   - Trendelenburg test: NEG     Assessment & Plan:  1. 1. Tendinopathy of gluteus medius -Patient symptoms are consistent with tendinopathy of the gluteus medius.  Patient likely also has some weakness of the tensor fascia lata and would benefit from a comprehensive strengthening program of the affected area.  Patient was provided with exercises and advised to continue increasing resistance as tolerated with the exercises. Patient was also advised to start taking some nutritional supplements such as creatine monohydrate to help with increasing cellular energy metabolism. Patient to continue doing exercises for the next 6 weeks and follow-up at that time.  If no improvement can consider imaging such as MRI to evaluate if any structural issue with the affected tendon    Brenton Grills MD, PGY-4  Sports Medicine Fellow [facility]  I observed and examined the patient with the [facility] resident and agree with assessment and plan.  Note reviewed and modified by me. Sterling Big, MD

## 2022-12-28 ENCOUNTER — Encounter: Payer: Self-pay | Admitting: Sports Medicine

## 2022-12-28 ENCOUNTER — Ambulatory Visit (INDEPENDENT_AMBULATORY_CARE_PROVIDER_SITE_OTHER): Payer: Medicare Other | Admitting: Sports Medicine

## 2022-12-28 VITALS — BP 136/88 | Ht 65.0 in | Wt 170.0 lb

## 2022-12-28 DIAGNOSIS — M67952 Unspecified disorder of synovium and tendon, left thigh: Secondary | ICD-10-CM | POA: Diagnosis not present

## 2022-12-28 NOTE — Assessment & Plan Note (Signed)
Her findings still seem to be consistent with gluteus medius and probably some tensor fascia lata weakness  We discussed progressing her abduction exercise series Being more consistent with this If this is not continue to resolve over the next 6 weeks I would want to send her to formal physical therapy  Because she has had more muscular aches and pains and has a history of long COVID I did suggest that she discuss with her physician Dr. Waynard Edwards if the level of her CPK should be checked.  I have not seen a CBC sed rate or C-reactive protein to see if any of those are abnormal.  I am concerned that some of her anxiety could be triggered if her free T4 at the upper limits of normal is just high enough that she is sensitive to that  I did advise that the patellar tendon pain was more likely related to previous evidence of some patellofemoral compartment arthritis but more likely from overuse of the right lower extremity because the left lower extremity shows some weakness

## 2022-12-28 NOTE — Progress Notes (Signed)
Chief complaint follow-up of left hip pain with some low back pain  Patient is clearly improved and says that the pain is reduced about 40% She is able to walk more She has been more consistent in doing all 4 of the hip abduction exercises over the last 2 to 3 weeks Sometimes the pain goes up into her left low back but does not radiate down into her leg or have a radicular pattern She notices this when lying on her left side  Of interest is that she had a significant long COVID issue and feels that she has had more fatigue since that time This also could be associated with her thyroid disease which she has been going through some adjustments In regard to that she has also been more anxious but has not had abnormal thyroid tests  Nevertheless she is concerned that some of her muscular symptoms might be residual.  In the past when she took statins she had significant muscular weakness.  Physical examination Pleasant older female in no acute distress BP 136/88   Ht 5\' 5"  (1.651 m)   Wt 170 lb (77.1 kg)   BMI 28.29 kg/m   Left hip Hip range of motion is normal Hip flexion is strong and is normal FABER and FADIR unremarkable Some tenderness over the left greater trochanter There is still at least moderate weakness on pure abduction with the gluteus medius and minimus Weakness on testing the tensor fascia Lata Some improvement in testing the gluteus maximus  Straight leg raise is negative SI joint shows movement  Right knee exam is unremarkable and no significant tenderness over the patellar tendon  In reviewing her labs her free T4 is at the upper limits of normal Her TSH is below 2

## 2023-01-06 ENCOUNTER — Other Ambulatory Visit: Payer: Self-pay | Admitting: Medical Genetics

## 2023-01-06 DIAGNOSIS — Z006 Encounter for examination for normal comparison and control in clinical research program: Secondary | ICD-10-CM

## 2023-01-12 ENCOUNTER — Other Ambulatory Visit
Admission: RE | Admit: 2023-01-12 | Discharge: 2023-01-12 | Disposition: A | Discharge status: Home / Self Care | Payer: Medicare Other | Source: Ambulatory Visit | Attending: Medical Genetics | Admitting: Medical Genetics

## 2023-01-12 DIAGNOSIS — Z006 Encounter for examination for normal comparison and control in clinical research program: Secondary | ICD-10-CM | POA: Insufficient documentation

## 2023-01-24 LAB — HELIX MOLECULAR SCREEN: Genetic Analysis Overall Interpretation: NEGATIVE

## 2023-01-24 LAB — GENECONNECT MOLECULAR SCREEN

## 2023-02-08 ENCOUNTER — Ambulatory Visit: Payer: Medicare Other | Admitting: Sports Medicine

## 2023-02-14 ENCOUNTER — Ambulatory Visit: Payer: Medicare Other | Admitting: Sports Medicine

## 2023-02-14 ENCOUNTER — Encounter: Payer: Self-pay | Admitting: Family Medicine

## 2023-02-14 ENCOUNTER — Ambulatory Visit (INDEPENDENT_AMBULATORY_CARE_PROVIDER_SITE_OTHER): Payer: Medicare Other | Admitting: Family Medicine

## 2023-02-14 VITALS — BP 120/86 | Ht 65.0 in | Wt 175.0 lb

## 2023-02-14 DIAGNOSIS — M67952 Unspecified disorder of synovium and tendon, left thigh: Secondary | ICD-10-CM

## 2023-02-14 DIAGNOSIS — M7052 Other bursitis of knee, left knee: Secondary | ICD-10-CM | POA: Diagnosis not present

## 2023-02-14 NOTE — Assessment & Plan Note (Signed)
Ongoing left hip pain with significant gluteal and hip weakness consistent with gluteal tendinopathy -She has had limited improvement with dedicated home exercise program  Plan: -Reviewed previous visit notes with Dr. Darrick Penna from 12/28/2022 and 11/23/2022 -At this time recommend formal physical therapy.  Referral to [facility] in [place] given -Should continue with her home exercise program -Follow-up in 6 to 8 weeks for reevaluation if worsening or not improving

## 2023-02-14 NOTE — Progress Notes (Signed)
DATE OF VISIT: 02/14/2023        Sarah Buchanan DOB: 1950-06-13 MRN: [id number]  CC:  f/u Lt hip pain, Lt knee pain   History of present Illness: Sarah Buchanan is a 72 y.o. female who presents for a follow-up visit for left hip and LBP Last seen by Dr Darrick Penna 12/28/22 - dx with glute medius tendinopathy - was advised to be more c/w HEP  Today she reports she has been doing HEP regularly 02/04/23 started having medial left knee pain so rested from hip exercises - was walking quickly in [place] for the week, lots of stairs - pain was 8/10 along pes anserine at the time - was trying to keep up with them, but hobbling along - has gained a few pounds over the last few weeks as well Was using ibuprofen for the knee, but needed to stop at recommendation of PCP due to elevated BP Having left pain at night -- mainly 3/10 Worse with sitting for long periods  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 Takes 600-500  daily   Cholecalciferol 25 MCG (1000 UT) tablet Take 2,000 Int'l Units by mouth daily.   Coenzyme Q10 (COQ-10) 100 MG CAPS Take 1 capsule by mouth daily.   estradiol (VIVELLE-DOT) 0.05 MG/24HR patch Place 1 patch onto the skin as directed. Wears patch for 5 days a week   levothyroxine (SYNTHROID) 112 MCG tablet Take 1 tablet by mouth 6 days a week, then 1.5 tablet by mouth once a week   Multiple Vitamin (MULTIVITAMIN) capsule Take 1 capsule by mouth daily.   Omega-3 Fatty Acids (FISH OIL) 1000 MG CAPS Take 1,280 mg by mouth daily.   Quercetin 500 MG CAPS Take 1 capsule by mouth daily.   RHOPRESSA 0.02 % SOLN Place 1 drop into both eyes daily.   S-Adenosylmethionine (SAM-E) 400 MG TABS Take 1 tablet by mouth daily.   Spirulina 500 MG TABS Take 1 tablet by mouth in the morning and at bedtime.   TURMERIC PO Take by mouth. Take 1160mg  once a  day   XIIDRA 5 % SOLN INT ONE GTT IN OU BID   No facility-administered encounter medications on file as of 02/14/2023.    Allergies: is allergic to atorvastatin, ezetimibe, prednisolone, rosuvastatin, and statins.  Physical Examination: Vitals: BP 120/86   Ht 5\' 5"  (1.651 m)   Wt 175 lb (79.4 kg)   BMI 29.12 kg/m  GENERAL:  Sarah Buchanan is a 72 y.o. female appearing their stated age, alert and oriented x 3, in no apparent distress.  SKIN: no rashes or lesions, skin clean, dry, intact MSK:  Left hip with full range of motion without pain.  She is tender to palpation over the left greater trochanter.  Continues to have ongoing hip weakness 4/5 throughout with associated significant gluteal weakness as well.  Negative FABER and FADIR.  Good hamstring flexibility. Right hip with full range of motion without pain, hip strength 5/5 throughout, normal gluteal strength throughout.  Left knee with trace effusion.  No increased redness or warmth.  Tender to palpation along the medial joint line and Pes anserine bursa.  Increased pain over the pes compared to the medial joint line.  No lateral joint line tenderness.  No tenderness over the patella, patellar tendon, tibial tubercle, quad.  Negative Lachman, negative McMurray, negative varus/valgus stress. Right knee with  full range of motion without swelling or effusion  While standing has bilateral pes planus with overpronation.  Does have increased genu valgum bilaterally No leg length discrepancy Walking without a limp Neurovascularly intact distally  Assessment & Plan Tendinopathy of left gluteus medius Ongoing left hip pain with significant gluteal and hip weakness consistent with gluteal tendinopathy -She has had limited improvement with dedicated home exercise program  Plan: -Reviewed previous visit notes with Dr. Darrick Penna from 12/28/2022 and 11/23/2022 -At this time recommend formal physical therapy.  Referral to [facility] in  [place] given -Should continue with her home exercise program -Follow-up in 6 to 8 weeks for reevaluation if worsening or not improving Pes anserinus bursitis of left knee Acute left medial knee pain x 2 weeks after recent trip to [place].  Was doing a lot more walking, no specific injury or trauma -History and exam most consistent with pes bursitis  Plan: -Diagnosis and treatment discussed -Fitted with knee sleeve in the office today.  Should wear with activity -Recommend Voltaren gel OTC - apply to affected area every 6-8 hours as needed.  Explained how to take medication & side effects.  Should avoid taking with PO NSAIDs. -Heat or ice as needed -Recommend formal physical therapy.  Referral to [facility] in [place] given today -Follow-up in 6 to 8 weeks for reevaluation if worsening or not improving   Patient expressed understanding & agreement with above.  Encounter Diagnoses  Name Primary?   Tendinopathy of left gluteus medius Yes   Pes anserinus bursitis of left knee     No orders of the defined types were placed in this encounter.

## 2023-02-14 NOTE — Patient Instructions (Signed)
Your knee pain is due to Pes Anserine bursitis Your hip pain is related to your ongoing gluteal/hip weakness and tendinopathy  - you should wear the knee sleeve with activity - You would benefit from the use of Voltaren gel every 6-hrs as needed.  This is a topical anti-inflammatory medication to help with pain and inflammation/swelling.  You can purchase this at your local pharmacy over-the-counter. - you can use heat or ice 1-2 times a day for 10-15 minutes as needed - you were given a referral to [facility] for your knee and your hip - you should follow-up in 6-8 weeks if worsening or not improving  Don't hesitate to reach out with any questions

## 2023-02-15 ENCOUNTER — Ambulatory Visit: Payer: Medicare Other | Admitting: Sports Medicine

## 2023-03-21 ENCOUNTER — Ambulatory Visit: Payer: Medicare Other | Admitting: Family Medicine

## 2023-04-10 ENCOUNTER — Ambulatory Visit (INDEPENDENT_AMBULATORY_CARE_PROVIDER_SITE_OTHER): Payer: Medicare Other | Admitting: Family Medicine

## 2023-04-10 ENCOUNTER — Encounter: Payer: Self-pay | Admitting: Family Medicine

## 2023-04-10 VITALS — BP 122/78 | Ht 65.0 in | Wt 162.0 lb

## 2023-04-10 DIAGNOSIS — S39012A Strain of muscle, fascia and tendon of lower back, initial encounter: Secondary | ICD-10-CM

## 2023-04-10 DIAGNOSIS — M67952 Unspecified disorder of synovium and tendon, left thigh: Secondary | ICD-10-CM | POA: Diagnosis not present

## 2023-04-10 DIAGNOSIS — M7052 Other bursitis of knee, left knee: Secondary | ICD-10-CM | POA: Diagnosis not present

## 2023-04-10 NOTE — Progress Notes (Signed)
DATE OF VISIT: 04/10/2023        Sarah Buchanan DOB: [birth year] MRN: [id number]  CC:  f/u Lt hip and Lt knee; new complaint of right low back pain  History of present Illness: Sarah Buchanan is a 73 y.o. female who presents for a follow-up visit  Last seen by me 02/14/23 Has Lt hip pain due to glut med tendinopathy & Lt knee pain with pes bursitis - was referred to formal PT at last visit - also given knee sleeve last visit  Today she reports knee and hip feeling better Back flared about 1 week ago after heavy lifting -Hoping somebody move and caring box of heavy books Some spasms along the right lower back Occasional radiation across the low back to the left hip/buttock area Denies changes in bowel or bladder Denies any lower extremity weakness Doing some HEP which has been helping Overall is improving, would be interested in learning exercises to help this   Medications:  Outpatient Encounter Medications as of 04/10/2023  Medication Sig   Calcium Citrate-Vitamin D (CALCIUM CITRATE + D PO) Take by mouth. Takes 600-500  daily   estradiol (VIVELLE-DOT) 0.05 MG/24HR patch Place 1 patch onto the skin as directed. Wears patch for 5 days a week   levothyroxine (SYNTHROID) 112 MCG tablet Take 1 tablet by mouth 6 days a week, then 1.5 tablet by mouth once a week   Multiple Vitamin (MULTIVITAMIN) capsule Take 1 capsule by mouth daily.   S-Adenosylmethionine (SAM-E) 400 MG TABS Take 1 tablet by mouth daily.   XIIDRA 5 % SOLN INT ONE GTT IN OU BID   No facility-administered encounter medications on file as of 04/10/2023.    Allergies: is allergic to atorvastatin, ezetimibe, prednisolone, rosuvastatin, and statins.  Physical Examination: Vitals: BP 122/78   Ht 5\' 5"  (1.651 m)   Wt 162 lb (73.5 kg)   BMI 26.96 kg/m  GENERAL:  Sarah Buchanan is a 73 y.o. female appearing their stated age, alert and oriented x 3, in no apparent distress.  MSK:  L spine: Near  full range of motion without pain.  No midline tenderness.  Mild right-sided paraspinal tenderness at the lumbosacral junction.  Negative SLR bilaterally.  Lower extremity strength 5/5.  Able to toe walk and heel walk. Hips: Full range of motion without pain.  No tenderness over the greater trochanters bilaterally.  Hip strength 5/5 throughout Knees: Bilateral knees with full range of motion without pain.  No tenderness along the medial or lateral joint lines.  No ligamentous laxity.  Negative McMurray bilaterally. Walking without a limp NEURO: sensation intact to light touch lower extremity bilaterally, DTR 2/4 Achilles and patella bilaterally VASC: no edema   Assessment & Plan Strain of lumbar region, initial encounter Acute right sided lumbar strain after carrying heavy books, has been improving -No red flag symptoms  Plan: -Reassurance provided, she does seem to be making progress -OTC NSAIDs as needed -Heat or ice as needed -Provided with additional home exercise program to help with symptoms -Follow-up 4 to 6 weeks if no improvement, sooner as needed.  Would consider updated lumbar spine imaging if symptoms worsening.  Otherwise can follow-up as needed Tendinopathy of left gluteus medius Symptoms currently greatly improved  Plan: -Should continue her home exercise program -Follow-up with [place] as needed Pes anserinus bursitis of left knee Symptoms have resolved  Plan: -Advance activity as tolerated -Follow-up as needed  Patient expressed understanding & agreement with above.  Encounter Diagnoses  Name Primary?   Strain of lumbar region, initial encounter Yes   Tendinopathy of left gluteus medius    Pes anserinus bursitis of left knee     No orders of the defined types were placed in this encounter.

## 2023-04-10 NOTE — Assessment & Plan Note (Signed)
Symptoms currently greatly improved  Plan: -Should continue her home exercise program -Follow-up with [place] as needed

## 2023-07-13 ENCOUNTER — Other Ambulatory Visit: Payer: Self-pay | Admitting: Internal Medicine

## 2023-07-13 DIAGNOSIS — M8580 Other specified disorders of bone density and structure, unspecified site: Secondary | ICD-10-CM

## 2023-09-29 ENCOUNTER — Other Ambulatory Visit: Payer: Self-pay | Admitting: Internal Medicine

## 2023-09-29 DIAGNOSIS — Z1231 Encounter for screening mammogram for malignant neoplasm of breast: Secondary | ICD-10-CM

## 2023-11-02 ENCOUNTER — Ambulatory Visit
Admission: RE | Admit: 2023-11-02 | Discharge: 2023-11-02 | Disposition: A | Discharge status: Home / Self Care | Source: Ambulatory Visit

## 2023-11-02 ENCOUNTER — Other Ambulatory Visit: Payer: Self-pay | Admitting: Internal Medicine

## 2023-11-02 DIAGNOSIS — Z1231 Encounter for screening mammogram for malignant neoplasm of breast: Secondary | ICD-10-CM

## 2023-11-21 ENCOUNTER — Encounter: Payer: Self-pay | Admitting: Family Medicine

## 2023-11-21 ENCOUNTER — Ambulatory Visit (INDEPENDENT_AMBULATORY_CARE_PROVIDER_SITE_OTHER): Admitting: Family Medicine

## 2023-11-21 VITALS — BP 126/88 | Ht 65.0 in | Wt 173.0 lb

## 2023-11-21 DIAGNOSIS — M545 Low back pain, unspecified: Secondary | ICD-10-CM | POA: Diagnosis present

## 2023-11-21 DIAGNOSIS — M1612 Unilateral primary osteoarthritis, left hip: Secondary | ICD-10-CM

## 2023-11-21 DIAGNOSIS — M47816 Spondylosis without myelopathy or radiculopathy, lumbar region: Secondary | ICD-10-CM

## 2023-11-21 DIAGNOSIS — G8929 Other chronic pain: Secondary | ICD-10-CM

## 2023-11-21 DIAGNOSIS — M25511 Pain in right shoulder: Secondary | ICD-10-CM

## 2023-11-21 NOTE — Progress Notes (Signed)
 DATE OF VISIT: 11/21/2023        Sarah Buchanan DOB: [birth year] MRN: [id number]  CC:  f/u left hip pain and low back pain  History of present Illness: Sarah Buchanan is a 73 y.o. female who presents for a follow-up visit Last seen by me 04/10/23 for lumbar strain, glute tendinopathy Has been living at [facility] in [place] area Seen by sports medicine doc at [facility] 11/08/23 - Dr Miriam Rower - Xrays of hip showing mild left hip OA - Xrays of Lspine showing moderate DJD\/DDD throughout - referred back to PT - has considered possible   Relocating back to [place], so re-establishing with us  Pain in the AM when waking  Feeling in the left low back, left buttock Associated burning pain in the left buttock Last PT was March 2025 In transition with moving, new dog Has done well with focused hip exercises for the glute medius in the past - prefers HEP compared to formal PT  Also right shoulder pain Pain with pushups and lifting arm Pain at rest   Medications:  Outpatient Encounter Medications as of 11/21/2023  Medication Sig   Calcium Citrate-Vitamin D (CALCIUM CITRATE + D PO) Take by mouth. Takes 600-500  daily   estradiol (VIVELLE-DOT) 0.05 MG/24HR patch Place 1 patch onto the skin as directed. Wears patch for 5 days a week   levothyroxine (SYNTHROID) 112 MCG tablet Take 1 tablet by mouth 6 days a week, then 1.5 tablet by mouth once a week   Multiple Vitamin (MULTIVITAMIN) capsule Take 1 capsule by mouth daily.   S-Adenosylmethionine (SAM-E) 400 MG TABS Take 1 tablet by mouth daily.   XIIDRA 5 % SOLN INT ONE GTT IN OU BID   No facility-administered encounter medications on file as of 11/21/2023.    Allergies: is allergic to atorvastatin, ezetimibe, prednisolone, rosuvastatin, and statins.  Physical Examination: Vitals: BP 126/88   Ht 5' 5 (1.651 m)   Wt 173 lb (78.5 kg)   BMI 28.79 kg/m  GENERAL:  Sarah Buchanan is a 73 y.o. female appearing their  stated age, alert and oriented x 3, in no apparent distress.  SKIN: no rashes or lesions, skin clean, dry, intact MSK: L spine: No gross deformity.  No midline or paraspinal tenderness.  Mild left-sided paraspinal hypertonia.  Good range of motion without pain.  No SI joint tenderness.  Able to toe walk and heel walk.  Negative straight leg raise bilaterally.  Negative FABER bilaterally.  Normal quad and hamstring strength 5/5, normal ankle strength 5/5 throughout bilaterally Hip: Left hip with slightly decreased internal and external rotation without pain.  Negative logroll.  Negative FABER, negative FADIR.  Hip strength 4/5 throughout on the left Right hip with full range of motion without pain or weakness.  Negative FABER, negative FADIR No leg length difference, walking without a limp NEURO: sensation intact to light touch, DTR 2/4 Achilles and patella bilaterally VASC: pulses 2+ and symmetric lower extremity bilaterally, no edema  Radiology: XRAY: Outside x-rays completed 11/08/2023, images not available for review at this time  L-spine x-ray 2 view showing: FINDINGS/IMPRESSION:  Segmentation: There are 5 nonrib-bearing lumbar-like vertebrae with  hypoplastic T12 ribs and sacralization of L5 on the left.  Alignment: Mild left listhesis of L1 on L2.  Bones: The vertebral body heights are preserved.  Degenerative changes: Moderate multilevel degenerative disc disease with  lower lumbar facet arthrosis.  Soft Tissues: No suspicious soft tissue abnormality. Metallic cylindrical  objects project over  the pelvis.   Left hip x-rays 3 views with or without pelvis showing: FINDINGS/IMPRESSION:  Bones: No acute fracture or dislocation.  Alignment: Anatomic alignment.  Joints: Lower lumbar spondylosis with transitional anatomy at L5. Mild left  hip joint space narrowing.  Soft tissue: No acute soft tissue abnormality. Heterotopic ossification.  Metallic cylindrical objects project over the  pelvis.   Assessment & Plan Chronic left-sided low back pain without sciatica Chronic left-sided low back pain with associated lumbar spondylosis, some radiation into the left buttock which could be radicular versus sciatica.  Also has mild hip osteoarthritis which may be contributing  Plan: - Reviewed visit notes with outside sports medicine specialist from 11/08/2023. - Given her symptoms and chronic nature of her symptoms, recommend MRI lumbar spine to rule out pinched nerve or herniated disc.  Referral for this was given - Was given updated home exercise program.  She prefers home exercises over formal physical therapy.  She has done well with these in the past - Follow-up after MRI to review results and discuss further treatment.  Could consider possible facet and/or epidural injections pending MRI results Lumbar spondylosis Chronic left-sided low back pain with associated lumbar spondylosis, some radiation into the left buttock which could be radicular versus sciatica.  Also has mild hip osteoarthritis which may be contributing  Plan: - Reviewed visit notes with outside sports medicine specialist from 11/08/2023. - Given her symptoms and chronic nature of her symptoms, recommend MRI lumbar spine to rule out pinched nerve or herniated disc.  Referral for this was given - Was given updated home exercise program.  She prefers home exercises over formal physical therapy.  She has done well with these in the past - Will try to get a disc with recent x-rays at outside provider on 11/08/2023.  Will bring with her to next visit - Follow-up after MRI to review results and discuss further treatment.  Could consider possible facet and/or epidural injections pending MRI results Primary osteoarthritis of left hip Chronic left hip pain and associated buttock pain, history of gluteal tendinopathy. - Recent x-rays at outside provider 11/08/2023 showing mild osteoarthritis, do not have images available for  review  Plan: - Explained to patient that history and exam today most consistent with likely lumbar etiology for her symptoms.  Hip arthritis may be contributing as she does have some decreased range of motion in the hip, but does not have any other significant findings on exam today - She will try to get a disc with recent x-rays from outside provider so we can review at next visit - She was given updated home exercise program to help strengthen and stabilize around the core and hip - Will follow-up with me in about 2 weeks for reevaluation, could potentially consider trial of intra-articular hip injection in the future if symptoms persist and pending review of her x-rays Chronic right shoulder pain Patient mentioned chronic right shoulder pain, we were unable to fully assess today  Plan: - Advised patient to schedule follow-up visit with me in approximately 2 weeks after my upcoming vacation.  We will plan further evaluation and possible [facility] ultrasound at that time - She will schedule follow-up appointment   Patient expressed understanding & agreement with above.  Encounter Diagnoses  Name Primary?   Chronic left-sided low back pain without sciatica Yes   Lumbar spondylosis    Primary osteoarthritis of left hip    Chronic right shoulder pain     Orders Placed This Encounter  Procedures   MR Lumbar Spine Wo Contrast

## 2023-11-29 ENCOUNTER — Ambulatory Visit
Admission: RE | Admit: 2023-11-29 | Discharge: 2023-11-29 | Disposition: A | Discharge status: Home / Self Care | Source: Ambulatory Visit | Attending: Family Medicine | Admitting: Family Medicine

## 2023-11-29 DIAGNOSIS — G8929 Other chronic pain: Secondary | ICD-10-CM

## 2023-11-29 DIAGNOSIS — M47816 Spondylosis without myelopathy or radiculopathy, lumbar region: Secondary | ICD-10-CM

## 2023-12-05 ENCOUNTER — Other Ambulatory Visit: Admitting: Family Medicine

## 2023-12-12 ENCOUNTER — Other Ambulatory Visit: Payer: Self-pay

## 2023-12-12 ENCOUNTER — Ambulatory Visit: Attending: Family Medicine | Admitting: Physical Therapy

## 2023-12-12 ENCOUNTER — Ambulatory Visit: Payer: Self-pay | Admitting: Family Medicine

## 2023-12-12 ENCOUNTER — Encounter: Payer: Self-pay | Admitting: Physical Therapy

## 2023-12-12 ENCOUNTER — Ambulatory Visit (INDEPENDENT_AMBULATORY_CARE_PROVIDER_SITE_OTHER): Admitting: Family Medicine

## 2023-12-12 ENCOUNTER — Encounter: Payer: Self-pay | Admitting: Family Medicine

## 2023-12-12 ENCOUNTER — Ambulatory Visit: Payer: Self-pay

## 2023-12-12 VITALS — BP 132/80 | Ht 65.0 in | Wt 173.0 lb

## 2023-12-12 DIAGNOSIS — M6281 Muscle weakness (generalized): Secondary | ICD-10-CM | POA: Insufficient documentation

## 2023-12-12 DIAGNOSIS — M47816 Spondylosis without myelopathy or radiculopathy, lumbar region: Secondary | ICD-10-CM

## 2023-12-12 DIAGNOSIS — R252 Cramp and spasm: Secondary | ICD-10-CM | POA: Insufficient documentation

## 2023-12-12 DIAGNOSIS — M25511 Pain in right shoulder: Secondary | ICD-10-CM | POA: Diagnosis not present

## 2023-12-12 DIAGNOSIS — G8929 Other chronic pain: Secondary | ICD-10-CM

## 2023-12-12 DIAGNOSIS — M67911 Unspecified disorder of synovium and tendon, right shoulder: Secondary | ICD-10-CM

## 2023-12-12 DIAGNOSIS — M6289 Other specified disorders of muscle: Secondary | ICD-10-CM | POA: Diagnosis not present

## 2023-12-12 DIAGNOSIS — M48061 Spinal stenosis, lumbar region without neurogenic claudication: Secondary | ICD-10-CM
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
## 2023-12-12 NOTE — Progress Notes (Signed)
 DATE OF VISIT: 12/12/2023        Sarah Buchanan DOB: 05/23/1950 MRN: [id number]  Discussed the use of AI scribe software for clinical note transcription with the patient, who gave verbal consent to proceed.  History of Present Illness Sarah Buchanan is a 73 year old female with chronic low back pain and right shoulder pain who presents for follow-up and evaluation.  Chronic low back and left hip pain - Chronic left-sided low back pain associated with spondylosis and left hip osteoarthritis - Ongoing pain and discomfort in the left low back, left hip, and left abdominal area - MRI of the lumbar spine completed on November 29, 2023, for further evaluation with findings as noted below - Significant abdominal surgery on February 01, 2021, including robotic hysterectomy with sacral colpopexy and mesh placement.  She thinks this may be contributing to her pain - Persistent pain and discomfort in the left lower abdominal area, radiating from the front of the hip towards the back since surgery - No history of prior pelvic floor dysfunction - No prior pelvic therapy - Follow-up appointment with OB/GYN scheduled for January 10, 2024  Right shoulder pain - Ongoing right shoulder pain for the past year - Pain localized to the anterior and lateral aspects of the shoulder - Pain worsens with overhead activity and lifting - No associated weakness - No prior imaging of the shoulder - No history of specific injury or trauma    Medications:  Outpatient Encounter Medications as of 12/12/2023  Medication Sig   Calcium Citrate-Vitamin D (CALCIUM CITRATE + D PO) Take by mouth. Takes 600-500  daily   estradiol (VIVELLE-DOT) 0.05 MG/24HR patch Place 1 patch onto the skin as directed. Wears patch for 5 days a week   levothyroxine (SYNTHROID) 112 MCG tablet Take 1 tablet by mouth 6 days a week, then 1.5 tablet by mouth once a week   Multiple Vitamin (MULTIVITAMIN) capsule Take 1 capsule by  mouth daily.   S-Adenosylmethionine (SAM-E) 400 MG TABS Take 1 tablet by mouth daily.   XIIDRA 5 % SOLN INT ONE GTT IN OU BID   No facility-administered encounter medications on file as of 12/12/2023.    Allergies: is allergic to atorvastatin, ezetimibe, prednisolone, rosuvastatin, and statins.  Physical Examination: Vitals: BP 132/80   Ht 5' 5 (1.651 m)   Wt 173 lb (78.5 kg)   BMI 28.79 kg/m  Physical Exam GENERAL: Alert, oriented x3, no acute distress, pleasant. MUSCULOSKELETAL:  Right shoulder without gross deformity, full ROM, positive painful arc, tender over bicipital groove and greater tuberosity, no tenderness over AC joint, positive empty can, positive Hawkins, positive Neer, mildly positive Speed's, rotator cuff strength 5-/5.  Left shoulder full ROM, no pain, weakness, or instability.  Normal gross lower extremity strength bilaterally.Walking without limp. NEUROLOGICAL: Sensation intact to light touch upper extremities bilaterally. DTR 2/4 biceps, triceps, brachioradialis bilaterally. CARDIOVASCULAR: Radial artery 2+ and symmetric bilaterally. EXTREMITIES: No upper extremity edema.    Radiology: [facility] ultrasound right shoulder Date: 12/12/2023 Indication: Right shoulder pain Findings: - Biceps tendon position within the bicipital groove.  Normal appearance in short and long axis views - Subscapularis with hypoechoic change, no signs of tearing.  Well-visualized in short and long axis views.  No dynamic internal impingement on the coracoid - AC joint with degenerative changes - Increased fluid in the subacromial bursa with mild dynamic subacromial impingement - Supraspinatus with hypoechoic change along the insertional footplate, small calcification along the articular surface with likely  associated small partial articular sided tear.  Well-visualized in short and long axis views - Normal-appearing infraspinatus and teres minor - Normal-appearing posterior glenohumeral  joint  Impression: - Rotator cuff tendinopathy of the supraspinatus with likely partial articular sided tear - Subscapularis tendinopathy - Subacromial bursitis with mild subacromial impingement Images and interpretation completed by Krystal Lowing, DO and Rainell Cedar, DO    MRI lumbar spine 11/29/2023 showing: FINDINGS:   BONES AND ALIGNMENT: Mild degenerative retrolisthesis at L1-L2. Normal vertebral body heights. Bone marrow signal is unremarkable.   SPINAL CORD: The conus terminates normally.   SOFT TISSUES: No paraspinal mass.   T10-T11: Mild diffuse disc bulging causing mild central spinal canal stenosis.   T11-T12: Mild diffuse disc bulging causing mild central spinal canal stenosis.   T12-L1: Mild diffuse disc bulging causing mild central spinal canal stenosis.   L1-L2: Degenerative retrolisthesis and mild diffuse disc bulging with mild central spinal canal stenosis and bilateral lateral recess stenosis. No apparent nerve root impingement.   L2-L3: Broad-based disc bulging which is eccentric to the right. There is mild central spinal canal stenosis and right lateral recess stenosis.   L3-L4: Broad-based disc bulging with mild central spinal canal stenosis and bilateral lateral recess stenosis.   L4-L5: Diffuse disc bulging and a small right posterolateral caudal extrusion which is seen in image 8 of the sagittal series. There is central spinal canal stenosis and right lateral recess stenosis. No definite nerve root impingement.   L5-S1: Right-sided facet hypertrophic changes. The spinal canal and neural foramina are patent.   IMPRESSION: 1. Multilevel mild central spinal canal stenosis from T10-11 through L3-4 related to diffuse/broad-based disc bulging, with associated lateral recess stenoses as described. No apparent nerve root impingement at these levels. 2. L4-5 central canal and right lateral recess stenosis related to diffuse disc bulge and small  right posterolateral caudal extrusion. No definite nerve root impingement. 3. Right-sided facet hypertrophy at L5-S1 with patent canal and foramina.  Assessment & Plan Right shoulder rotator cuff tendinopathy with possible partial supraspinatus tear, subacromial impingement and bursitis, and AC joint osteoarthritis Chronic right shoulder pain with positive painful arc, tenderness, and positive tests indicating rotator cuff tendinopathy and impingement. [facility] ultrasound shows hypoechoic changes in subscapularis and supraspinatus, subacromial bursitis, and AC joint degeneration. - Initiated home exercise program for shoulder rehabilitation. - Apply Voltaren gel every 6-8 hours as needed for pain. - Used heat or ice as needed for pain management. - Follow up in 6 weeks for reevaluation. - Consider cortisone injection or shockwave therapy if no improvement.  Chronic left low back pain with lumbar spondylosis and spinal stenosis Chronic left-sided low back pain with lumbar spondylosis and spinal stenosis. MRI findings do not correlate with symptoms. Suspected pelvic floor dysfunction due to prior abdominal surgery. - Referred to pelvic floor physical therapy. - Continued activity as tolerated. - Consider hip joint injection or epidural injection if symptoms worsen or suggest radiculopathy. - Follow up in 6 weeks.  Left hip pain with possible pelvic floor dysfunction Left hip pain radiating from the lower abdominal area to the back, possibly related to prior abdominal surgery. Suspected pelvic floor dysfunction contributing to symptoms. - Referred to pelvic floor physical therapy. - Continued activity as tolerated. - Consider hip joint injection or epidural injection if symptoms worsen or suggest radiculopathy. - Follow up in 6 weeks.     Patient expressed understanding & agreement with above.  Encounter Diagnoses  Name Primary?   Pelvic floor dysfunction Yes  Lumbar spondylosis    Spinal  stenosis of lumbar region without neurogenic claudication    Chronic right shoulder pain    Tendinopathy of rotator cuff, right     Orders Placed This Encounter  Procedures   US  COMPLETE JOINT SPACE STRUCTURES UP RIGHT   Ambulatory referral to Physical Therapy     VISIT SUMMARY: Today, you were seen for follow-up and evaluation of your chronic low back pain, left hip pain, and right shoulder pain. We discussed your ongoing symptoms and reviewed your recent MRI results. We also talked about your post-surgical left lower abdominal pain and your upcoming follow-up with your OB/GYN.  YOUR PLAN: -RIGHT SHOULDER ROTATOR CUFF TENDINOPATHY WITH POSSIBLE PARTIAL SUPRASPINATUS TEAR, SUBACROMIAL IMPINGEMENT AND BURSITIS, AND AC JOINT OSTEOARTHRITIS: This condition involves inflammation and possible tearing of the shoulder tendons, along with joint degeneration. You should start a home exercise program for shoulder rehabilitation, apply Voltaren gel every 6-8 hours as needed for pain, and use heat or ice as needed. We will reevaluate in 6 weeks, and if there's no improvement, we may consider a cortisone injection or shockwave therapy.  -CHRONIC LEFT LOW BACK PAIN WITH LUMBAR SPONDYLOSIS AND SPINAL STENOSIS: This condition involves wear and tear of the lower spine and narrowing of the spinal canal. You are referred to pelvic floor physical therapy and should continue activities as tolerated. If symptoms worsen, we may consider hip joint or epidural injections. Follow up in 6 weeks.  -LEFT HIP PAIN WITH POSSIBLE PELVIC FLOOR DYSFUNCTION: Your left hip pain may be related to your previous abdominal surgery and could involve pelvic floor dysfunction. You are referred to pelvic floor physical therapy and should continue activities as tolerated. If symptoms worsen, we may consider hip joint or epidural injections. Follow up in 6 weeks.  INSTRUCTIONS: Please follow up with your OB/GYN on January 10, 2024, as  scheduled. Additionally, follow up with us  in 6 weeks for reevaluation of your shoulder, back, and hip pain. Contains text generated by [employer].

## 2023-12-12 NOTE — Progress Notes (Signed)
 Discussed during office visit 12/12/2023.  Please see visit notes for further details

## 2023-12-18 ENCOUNTER — Encounter: Payer: Self-pay | Admitting: Family Medicine
